# Patient Record
Sex: Female | Born: 1990 | Race: White | Hispanic: No | Marital: Single | Smoking: Never smoker
Health system: Southern US, Community
[De-identification: ages and names within clinical notes are randomized; demographics above are authoritative.]

## PROBLEM LIST (undated history)

## (undated) ENCOUNTER — Inpatient Hospital Stay (HOSPITAL_COMMUNITY): Payer: Self-pay

## (undated) DIAGNOSIS — F419 Anxiety disorder, unspecified: Secondary | ICD-10-CM

## (undated) DIAGNOSIS — A749 Chlamydial infection, unspecified: Secondary | ICD-10-CM

## (undated) HISTORY — PX: CARPAL TUNNEL RELEASE: SHX101

## (undated) HISTORY — PX: TONSILLECTOMY: SUR1361

## (undated) HISTORY — PX: ANKLE SURGERY: SHX546

## (undated) HISTORY — PX: WISDOM TOOTH EXTRACTION: SHX21

---
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
## 2014-01-06 NOTE — L&D Delivery Note (Signed)
Delivery Note 1959: Nurse call reports patient with rectal pressure and is C/C/+2.  In room for delivery.  Patient reports fatigue and desire for labor to be over.  Encouragement and reassurances given.  Patient instructed on pushing methods and delivered as below with staff and family support. FHT remained reassuring throughout pushing phase.   At 8:49 PM, on December 29, 2014, a viable female "Norma Collier" was delivered via Vaginal, Spontaneous Delivery (Presentation: Occiput Anterior).  After delivery of head, nuchal cord noted that shoulders and body was delivered through via somersault maneuver. Infant with good tone and spontaneous cry. Tactile stimulation given by provider and infant placed on mother's abdomen where nurse continued tactile stimulation. Infant APGAR: 8, 9.  Cord clamped, cut, and blood collected. Placenta delivered spontaneously and noted to be intact with 3VC upon inspection. Patient expresses desire to take placenta home. Vaginal inspection revealed small right periurethral abrasion that was repaired for hemostasis. Repair made with a 3.0 vicryl on a SH-needle with no need for additional anesthetic; patient tolerated procedure well.  Fundus firm, at 3FB below the umbilicus, and bleeding small.  Mother hemodynamically stable and infant skin to skin prior to provider exit.  Mother unsure of birth control method and opts to breastfeed.   Family wishes for infant to be circumcised during inpatient stay. Infant weight at one hour of life: 6lbs 10.9oz, 19.5in  Anesthesia: Epidural  Episiotomy:  None Lacerations:  None--Right Periurethral Abrasion Suture Repair: 3.0 vicryl Est. Blood Loss (mL):  450  Mom to postpartum.  Baby to Couplet care / Skin to Skin.  Cherre RobinsJessica L Leslie Jester MSN, CNM 12/29/2014, 9:20 PM

## 2014-06-08 ENCOUNTER — Inpatient Hospital Stay (HOSPITAL_COMMUNITY)
Admission: AD | Admit: 2014-06-08 | Discharge: 2014-06-08 | Disposition: A | Discharge status: Home / Self Care | Source: Ambulatory Visit | Attending: Obstetrics & Gynecology | Admitting: Obstetrics & Gynecology

## 2014-06-08 ENCOUNTER — Encounter (HOSPITAL_COMMUNITY): Payer: Self-pay | Admitting: *Deleted

## 2014-06-08 ENCOUNTER — Inpatient Hospital Stay (HOSPITAL_COMMUNITY)

## 2014-06-08 DIAGNOSIS — R109 Unspecified abdominal pain: Secondary | ICD-10-CM | POA: Diagnosis not present

## 2014-06-08 DIAGNOSIS — O219 Vomiting of pregnancy, unspecified: Secondary | ICD-10-CM | POA: Diagnosis not present

## 2014-06-08 DIAGNOSIS — O9989 Other specified diseases and conditions complicating pregnancy, childbirth and the puerperium: Secondary | ICD-10-CM | POA: Diagnosis not present

## 2014-06-08 DIAGNOSIS — Z3A08 8 weeks gestation of pregnancy: Secondary | ICD-10-CM | POA: Insufficient documentation

## 2014-06-08 DIAGNOSIS — O210 Mild hyperemesis gravidarum: Secondary | ICD-10-CM | POA: Insufficient documentation

## 2014-06-08 DIAGNOSIS — O26899 Other specified pregnancy related conditions, unspecified trimester: Secondary | ICD-10-CM

## 2014-06-08 LAB — URINALYSIS, ROUTINE W REFLEX MICROSCOPIC
BILIRUBIN URINE: NEGATIVE
GLUCOSE, UA: NEGATIVE mg/dL
Hgb urine dipstick: NEGATIVE
KETONES UR: 40 mg/dL — AB
Nitrite: NEGATIVE
Protein, ur: NEGATIVE mg/dL
Urobilinogen, UA: 0.2 mg/dL (ref 0.0–1.0)
pH: 5.5 (ref 5.0–8.0)

## 2014-06-08 LAB — CBC WITH DIFFERENTIAL/PLATELET
Basophils Absolute: 0 10*3/uL (ref 0.0–0.1)
Basophils Relative: 0 % (ref 0–1)
EOS PCT: 0 % (ref 0–5)
Eosinophils Absolute: 0 10*3/uL (ref 0.0–0.7)
HEMATOCRIT: 39.9 % (ref 36.0–46.0)
HEMOGLOBIN: 14.2 g/dL (ref 12.0–15.0)
Lymphocytes Relative: 5 % — ABNORMAL LOW (ref 12–46)
Lymphs Abs: 0.5 10*3/uL — ABNORMAL LOW (ref 0.7–4.0)
MCH: 30.1 pg (ref 26.0–34.0)
MCHC: 35.6 g/dL (ref 30.0–36.0)
MCV: 84.7 fL (ref 78.0–100.0)
MONOS PCT: 4 % (ref 3–12)
Monocytes Absolute: 0.4 10*3/uL (ref 0.1–1.0)
NEUTROS PCT: 91 % — AB (ref 43–77)
Neutro Abs: 9.5 10*3/uL — ABNORMAL HIGH (ref 1.7–7.7)
PLATELETS: 177 10*3/uL (ref 150–400)
RBC: 4.71 MIL/uL (ref 3.87–5.11)
RDW: 12.9 % (ref 11.5–15.5)
WBC: 10.4 10*3/uL (ref 4.0–10.5)

## 2014-06-08 LAB — HCG, QUANTITATIVE, PREGNANCY: HCG, BETA CHAIN, QUANT, S: 185872 m[IU]/mL — AB (ref <5)

## 2014-06-08 LAB — URINE MICROSCOPIC-ADD ON

## 2014-06-08 LAB — WET PREP, GENITAL
Trich, Wet Prep: NONE SEEN
YEAST WET PREP: NONE SEEN

## 2014-06-08 LAB — POCT PREGNANCY, URINE: Preg Test, Ur: POSITIVE — AB

## 2014-06-08 IMAGING — US US OB TRANSVAGINAL
1 series · 14 of 28 positions shown · non-contrast
Comparison: None.

CLINICAL DATA: Pregnant patient with lower abdominal pain for 1
day.

EXAM:
OBSTETRIC <14 WK US AND TRANSVAGINAL OB US
TECHNIQUE: Both transabdominal and transvaginal ultrasound examinations were
performed for complete evaluation of the gestation as well as the
maternal uterus, adnexal regions, and pelvic cul-de-sac.
Transvaginal technique was performed to assess early pregnancy.

[Series 1: us ob comp less 14 wk · 14 of 43 slices shown]
[im 2/43]
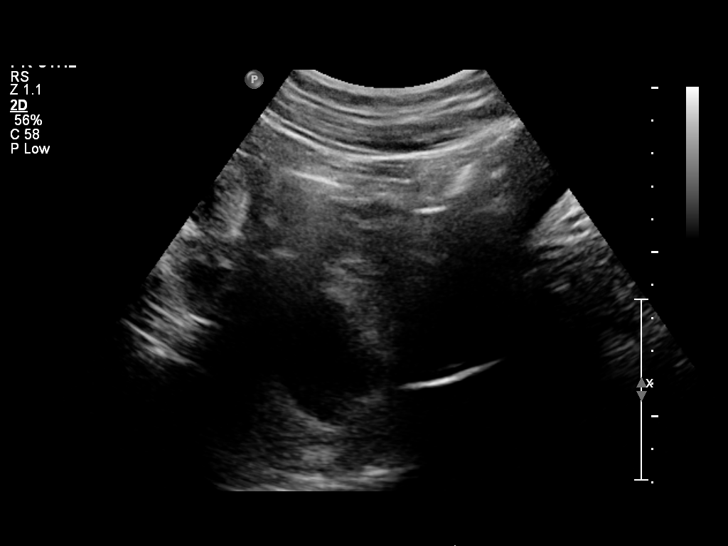
[im 5/43]
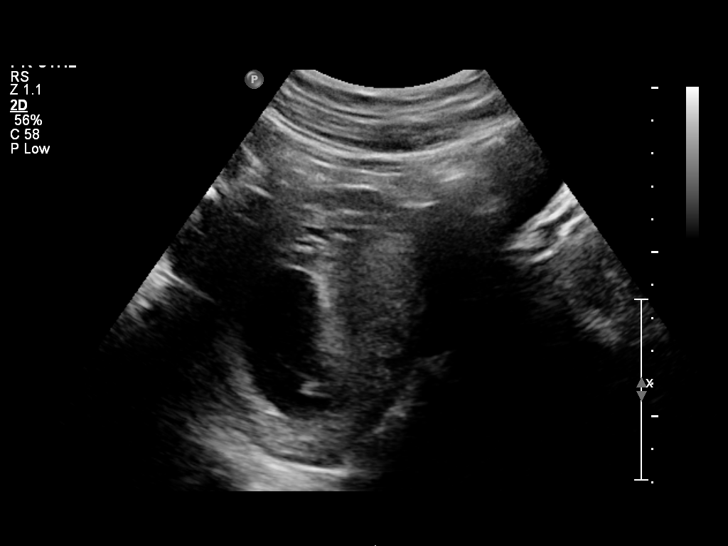
[im 8/43]
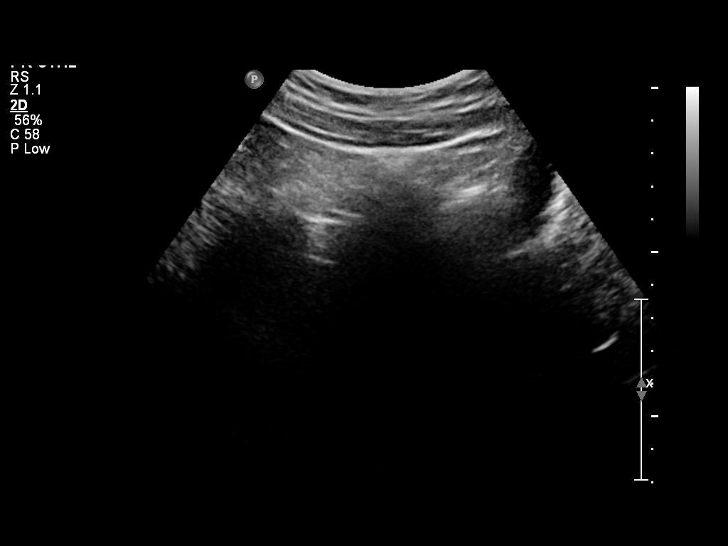
[im 11/43]
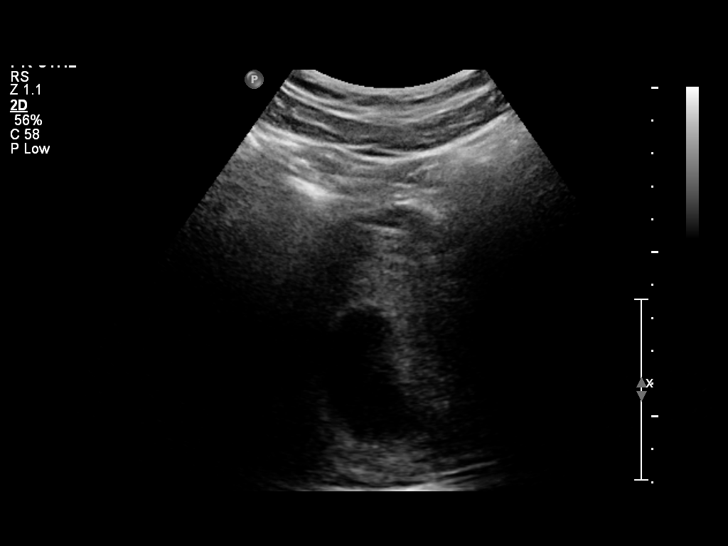
[im 15/43]
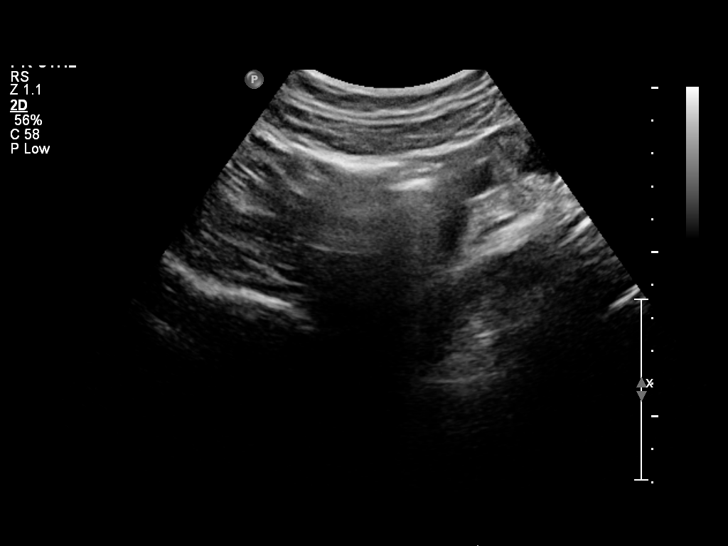
[im 18/43]
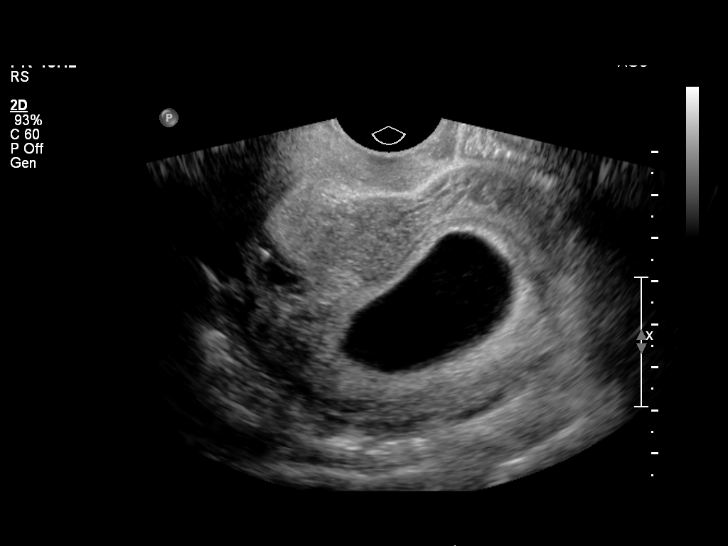
[im 21/43]
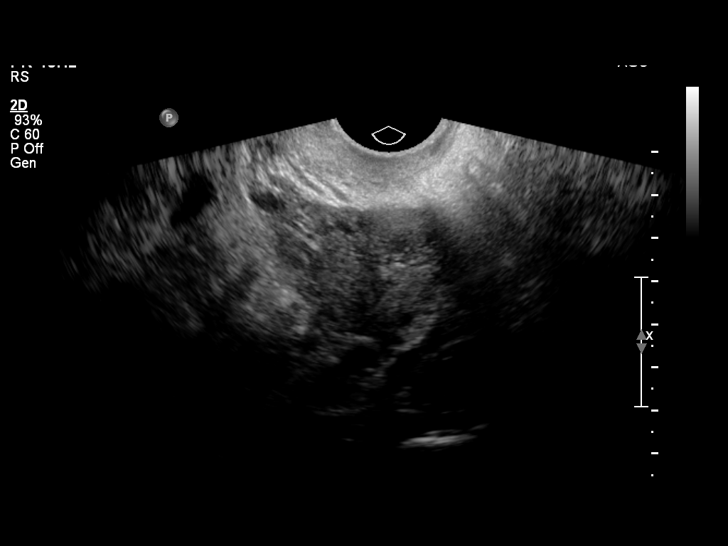
[im 24/43]
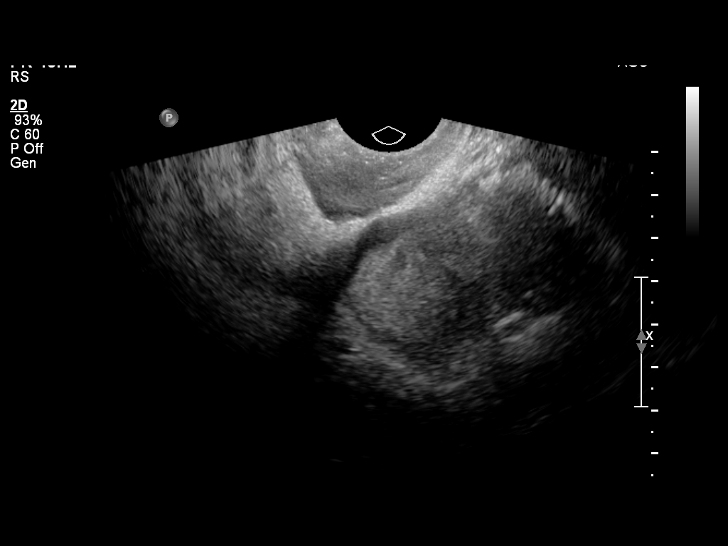
[im 27/43]
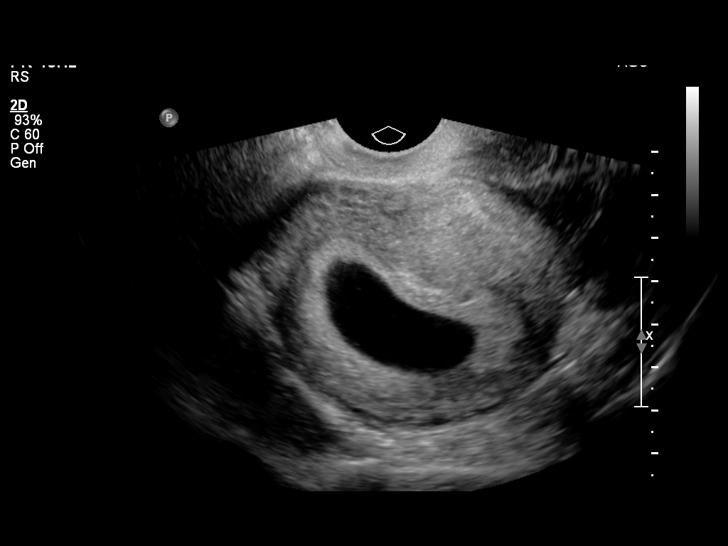
[im 30/43]
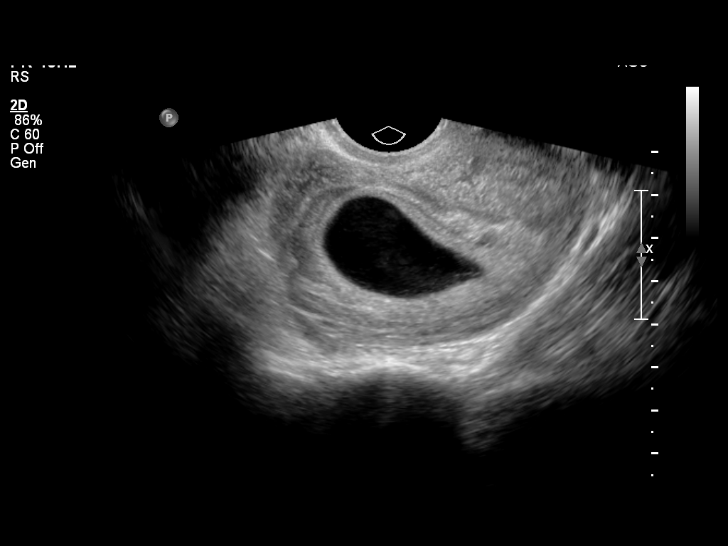
[im 33/43]
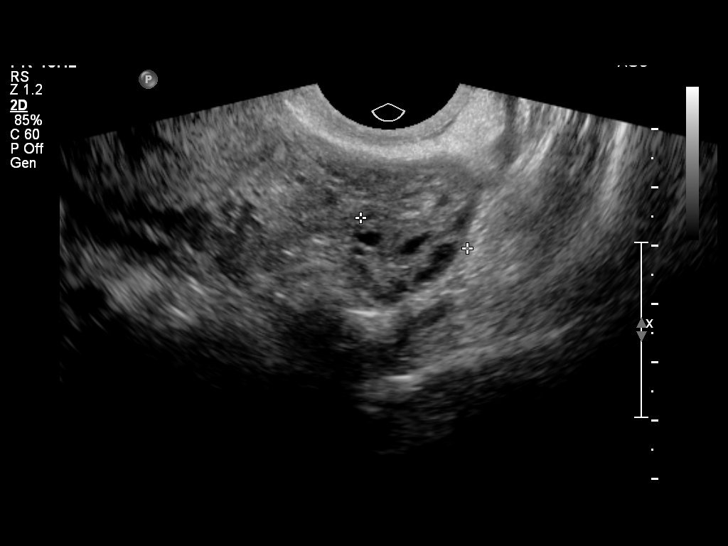
[im 36/43]
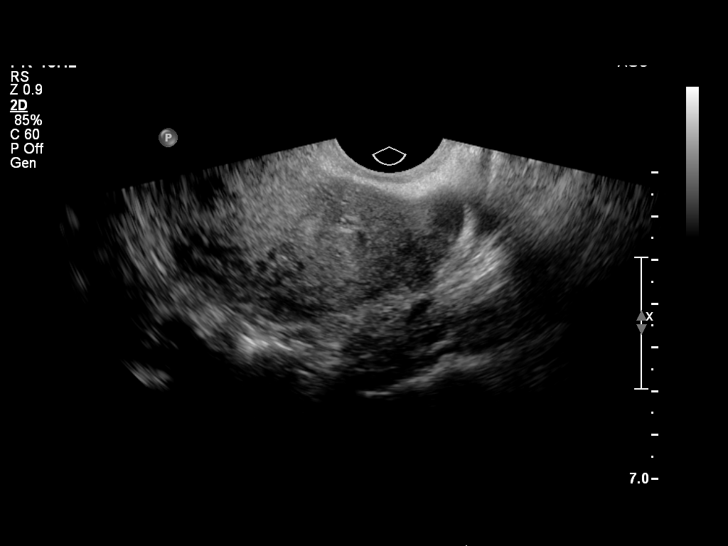
[im 39/43]
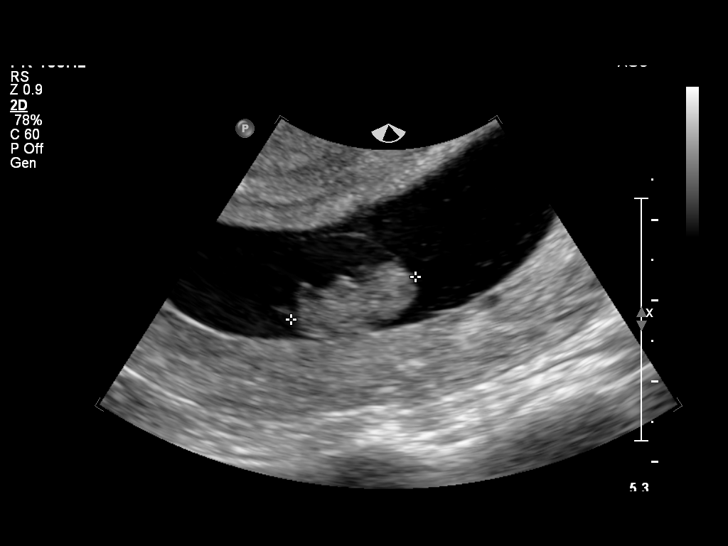
[im 43/43]
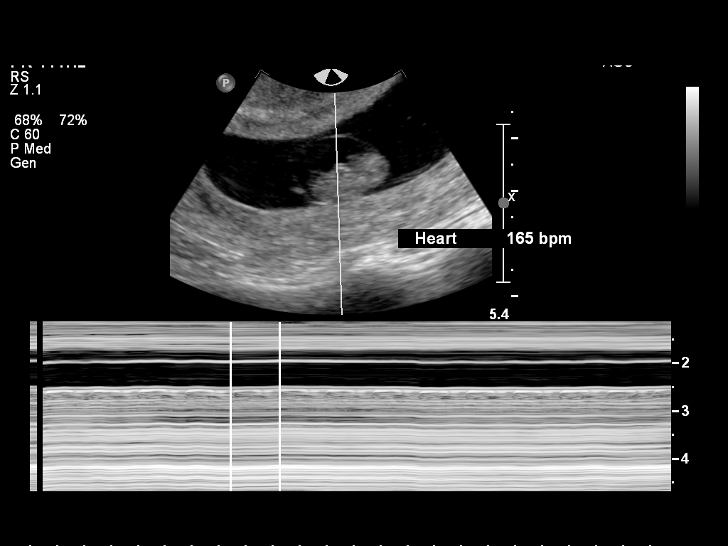

[14 of 28 positions shown; findings below may reference images not displayed]

FINDINGS: Intrauterine gestational sac: Visualized/normal in shape.

Yolk sac:  Present.

Embryo:  Present.

Cardiac Activity: Present.

Heart Rate: 165  bpm

CRL:  15.4  mm   8 w   0 d                  US EDC: [DATE]

Maternal uterus/adnexae: No subchorionic hemorrhage. The left ovary
appears normal measuring 1.9 x 2.6 x 1.2 cm. The right ovary is not
definitively seen. There is no pelvic free fluid. No adnexal mass.
IMPRESSION: Single live intrauterine pregnancy estimated gestational age 8 weeks
0 day for estimated date of delivery [DATE]. No complication.

## 2014-06-08 MED ORDER — PROMETHAZINE HCL 25 MG/ML IJ SOLN
25.0000 mg | Freq: Once | INTRAVENOUS | Status: AC
  Administered 2014-06-08: 25 mg via INTRAVENOUS
  Filled 2014-06-08: qty 1

## 2014-06-08 MED ORDER — PROMETHAZINE HCL 25 MG PO TABS: 25.0000 mg | ORAL_TABLET | Freq: Four times a day (QID) | ORAL | Status: DC | PRN

## 2014-06-08 NOTE — MAU Note (Signed)
Nausea/vomiting since 1am this morning. Cannot hold anything down since last night. Some lower abdominal cramping. Denies bleeding.

## 2014-06-08 NOTE — Discharge Instructions (Signed)
Morning Sickness °Morning sickness is when you feel sick to your stomach (nauseous) during pregnancy. You may feel sick to your stomach and throw up (vomit). You may feel sick in the morning, but you can feel this way any time of day. Some women feel very sick to their stomach and cannot stop throwing up (hyperemesis gravidarum). °HOME CARE °· Only take medicines as told by your doctor. °· Take multivitamins as told by your doctor. Taking multivitamins before getting pregnant can stop or lessen the harshness of morning sickness. °· Eat dry toast or unsalted crackers before getting out of bed. °· Eat 5 to 6 small meals a day. °· Eat dry and bland foods like rice and baked potatoes. °· Do not drink liquids with meals. Drink between meals. °· Do not eat greasy, fatty, or spicy foods. °· Have someone cook for you if the smell of food causes you to feel sick or throw up. °· If you feel sick to your stomach after taking prenatal vitamins, take them at night or with a snack. °· Eat protein when you need a snack (nuts, yogurt, cheese). °· Eat unsweetened gelatins for dessert. °· Wear a bracelet used for sea sickness (acupressure wristband). °· Go to a doctor that puts thin needles into certain body points (acupuncture) to improve how you feel. °· Do not smoke. °· Use a humidifier to keep the air in your house free of odors. °· Get lots of fresh air. °GET HELP IF: °· You need medicine to feel better. °· You feel dizzy or lightheaded. °· You are losing weight. °GET HELP RIGHT AWAY IF:  °· You feel very sick to your stomach and cannot stop throwing up. °· You pass out (faint). °MAKE SURE YOU: °· Understand these instructions. °· Will watch your condition. °· Will get help right away if you are not doing well or get worse. °Document Released: 01/31/2004 Document Revised: 12/28/2012 Document Reviewed: 06/09/2012 °ExitCare® Patient Information ©2015 [employer]. This information is not intended to replace advice given to you by  your health care provider. Make sure you discuss any questions you have with your health care provider. ° °Eating Plan for Hyperemesis Gravidarum °Severe cases of hyperemesis gravidarum can lead to dehydration and malnutrition. The hyperemesis eating plan is one way to lessen the symptoms of nausea and vomiting. It is often used with prescribed medicines to control your symptoms.  °WHAT CAN I DO TO RELIEVE MY SYMPTOMS? °Listen to your body. Everyone is different and has different preferences. Find what works best for you. Some of the following things may help: °· Eat and drink slowly. °· Eat 5-6 small meals daily instead of 3 large meals.   °· Eat crackers before you get out of bed in the morning.   °· Starchy foods are usually well tolerated (such as cereal, toast, bread, potatoes, pasta, rice, and pretzels).   °· Ginger may help with nausea. Add ¼ tsp ground ginger to hot tea or choose ginger tea.   °· Try drinking 100% fruit juice or an electrolyte drink. °· Continue to take your prenatal vitamins as directed by your health care provider. If you are having trouble taking your prenatal vitamins, talk with your health care provider about different options. °· Include at least 1 serving of protein with your meals and snacks (such as meats or poultry, beans, nuts, eggs, or yogurt). Try eating a protein-rich snack before bed (such as cheese and crackers or a half turkey or peanut butter sandwich). °WHAT THINGS SHOULD I   AVOID TO REDUCE MY SYMPTOMS? °The following things may help reduce your symptoms: °· Avoid foods with strong smells. Try eating meals in well-ventilated areas that are free of odors. °· Avoid drinking water or other beverages with meals. Try not to drink anything less than 30 minutes before and after meals. °· Avoid drinking more than 1 cup of fluid at a time. °· Avoid fried or high-fat foods, such as butter and cream sauces. °· Avoid spicy foods. °· Avoid skipping meals the best you can. Nausea can be  more intense on an empty stomach. If you cannot tolerate food at that time, do not force it. Try sucking on ice chips or other frozen items and make up the calories later. °· Avoid lying down within 2 hours after eating. °Document Released: 10/20/2006 Document Revised: 12/28/2012 Document Reviewed: 10/27/2012 °ExitCare® Patient Information ©2015 [employer]. This information is not intended to replace advice given to you by your health care provider. Make sure you discuss any questions you have with your health care provider. ° °

## 2014-06-08 NOTE — MAU Provider Note (Signed)
History     CSN: [id number]  Arrival date and time: 06/08/14 [id number]   First Provider Initiated Contact with Patient 06/08/14 2036      Chief Complaint  Patient presents with  . Emesis During Pregnancy   HPI  Ms. Norma Collier is a 24 y.o. G1P0 at 4635w0d who presents to MAU today with complaint of N/V. The patient states that [place] from [place] and began having severe N/V today. She is unable to keep anything down. She also states increased heartburn and lower abdominal cramping. She denies diarrhea, sick contacts, UTI symptoms or fever. She states pain is 4/10 now. She has not taken anything for pain.   OB History    Gravida Para Term Preterm AB TAB SAB Ectopic Multiple Living   1               History reviewed. No pertinent past medical history.  Past Surgical History  Procedure Laterality Date  . Tonsillectomy    . Ankle surgery    . Wisdom tooth extraction      No family history on file.  History  Substance Use Topics  . Smoking status: Never Smoker   . Smokeless tobacco: Never Used  . Alcohol Use: Yes     Comment: rare, when not pregnant     Allergies: No Known Allergies  No prescriptions prior to admission    Review of Systems  Constitutional: Negative for fever and malaise/fatigue.  Gastrointestinal: Positive for nausea, vomiting and abdominal pain. Negative for diarrhea and constipation.  Genitourinary: Negative for dysuria, urgency and frequency.       Neg - vaginal bleeding, discharge   Physical Exam   Blood pressure 98/52, pulse 80, temperature 97.6 F (36.4 C), resp. rate 20, height 5\' 6"  (1.676 m), weight 130 lb (58.968 kg), last menstrual period 04/01/2014.  Physical Exam  Nursing note and vitals reviewed. Constitutional: She is oriented to person, place, and time. She appears well-developed and well-nourished. No distress.  HENT:  Head: Normocephalic and atraumatic.  Cardiovascular: Normal rate.   Respiratory: Effort normal.   GI: Soft. Bowel sounds are normal. She exhibits no distension and no mass. There is tenderness (mild tenderness to palpation of the lower abdomen bilaterally). There is no rebound and no guarding.  Neurological: She is alert and oriented to person, place, and time.  Skin: Skin is warm and dry. No erythema.  Psychiatric: She has a normal mood and affect.   Results for orders placed or performed during the hospital encounter of 06/08/14 (from the past 24 hour(s))  Urinalysis, Routine w reflex microscopic ([facility])     Status: Abnormal   Collection Time: 06/08/14  8:03 PM  Result Value Ref Range   Color, Urine YELLOW YELLOW   APPearance CLEAR CLEAR   Specific Gravity, Urine >1.030 (H) 1.005 - 1.030   pH 5.5 5.0 - 8.0   Glucose, UA NEGATIVE NEGATIVE mg/dL   Hgb urine dipstick NEGATIVE NEGATIVE   Bilirubin Urine NEGATIVE NEGATIVE   Ketones, ur 40 (A) NEGATIVE mg/dL   Protein, ur NEGATIVE NEGATIVE mg/dL   Urobilinogen, UA 0.2 0.0 - 1.0 mg/dL   Nitrite NEGATIVE NEGATIVE   Leukocytes, UA TRACE (A) NEGATIVE  Urine microscopic-add on     Status: None   Collection Time: 06/08/14  8:03 PM  Result Value Ref Range   Squamous Epithelial / LPF RARE RARE   WBC, UA 3-6 <3 WBC/hpf   RBC / HPF 0-2 <3  RBC/hpf   Bacteria, UA RARE RARE   Urine-Other MUCOUS PRESENT   Pregnancy, urine POC     Status: Abnormal   Collection Time: 06/08/14  8:08 PM  Result Value Ref Range   Preg Test, Ur POSITIVE (A) NEGATIVE  CBC with Differential/Platelet     Status: Abnormal   Collection Time: 06/08/14  9:00 PM  Result Value Ref Range   WBC 10.4 4.0 - 10.5 K/uL   RBC 4.71 3.87 - 5.11 MIL/uL   Hemoglobin 14.2 12.0 - 15.0 g/dL   HCT 16.1 09.6 - 04.5 %   MCV 84.7 78.0 - 100.0 fL   MCH 30.1 26.0 - 34.0 pg   MCHC 35.6 30.0 - 36.0 g/dL   RDW 40.9 81.1 - 91.4 %   Platelets 177 150 - 400 K/uL   Neutrophils Relative % 91 (H) 43 - 77 %   Neutro Abs 9.5 (H) 1.7 - 7.7 K/uL   Lymphocytes Relative 5 (L) 12 - 46 %    Lymphs Abs 0.5 (L) 0.7 - 4.0 K/uL   Monocytes Relative 4 3 - 12 %   Monocytes Absolute 0.4 0.1 - 1.0 K/uL   Eosinophils Relative 0 0 - 5 %   Eosinophils Absolute 0.0 0.0 - 0.7 K/uL   Basophils Relative 0 0 - 1 %   Basophils Absolute 0.0 0.0 - 0.1 K/uL  ABO/Rh     Status: None (Preliminary result)   Collection Time: 06/08/14  9:00 PM  Result Value Ref Range   ABO/RH(D) O POS   hCG, quantitative, pregnancy     Status: Abnormal   Collection Time: 06/08/14  9:00 PM  Result Value Ref Range   hCG, Beta Chain, Quant, S [id number] (H) <5 mIU/mL  Wet prep, genital     Status: Abnormal   Collection Time: 06/08/14  9:00 PM  Result Value Ref Range   Yeast Wet Prep HPF POC NONE SEEN NONE SEEN   Trich, Wet Prep NONE SEEN NONE SEEN   Clue Cells Wet Prep HPF POC RARE (A) NONE SEEN   WBC, Wet Prep HPF POC MANY (A) NONE SEEN   US Ob Comp Less 14 Wks  06/08/2014   CLINICAL DATA:  Pregnant patient with lower abdominal pain for 1 day.  EXAM: OBSTETRIC <14 WK [place] AND TRANSVAGINAL OB US  TECHNIQUE: Both transabdominal and transvaginal ultrasound examinations were performed for complete evaluation of the gestation as well as the maternal uterus, adnexal regions, and pelvic cul-de-sac. Transvaginal technique was performed to assess early pregnancy.  COMPARISON:  None.  FINDINGS: Intrauterine gestational sac: Visualized/normal in shape.  Yolk sac:  Present.  Embryo:  Present.  Cardiac Activity: Present.  Heart Rate: 165  bpm  CRL:  15.4  mm   8 w   0 d                  [place] EDC: 01/18/2015  Maternal uterus/adnexae: No subchorionic hemorrhage. The left ovary appears normal measuring 1.9 x 2.6 x 1.2 cm. The right ovary is not definitively seen. There is no pelvic free fluid. No adnexal mass.  IMPRESSION: Single live intrauterine pregnancy estimated gestational age [redacted] weeks 0 day for estimated date of delivery 01/18/2015. No complication.   Electronically Signed   By: Rubye Oaks M.D.   On: 06/08/2014 22:11   US Ob  Transvaginal  06/08/2014   CLINICAL DATA:  Pregnant patient with lower abdominal pain for 1 day.  EXAM: OBSTETRIC <14 WK [place] AND TRANSVAGINAL OB  US  TECHNIQUE: Both transabdominal and transvaginal ultrasound examinations were performed for complete evaluation of the gestation as well as the maternal uterus, adnexal regions, and pelvic cul-de-sac. Transvaginal technique was performed to assess early pregnancy.  COMPARISON:  None.  FINDINGS: Intrauterine gestational sac: Visualized/normal in shape.  Yolk sac:  Present.  Embryo:  Present.  Cardiac Activity: Present.  Heart Rate: 165  bpm  CRL:  15.4  mm   8 w   0 d                  [place] EDC: 01/18/2015  Maternal uterus/adnexae: No subchorionic hemorrhage. The left ovary appears normal measuring 1.9 x 2.6 x 1.2 cm. The right ovary is not definitively seen. There is no pelvic free fluid. No adnexal mass.  IMPRESSION: Single live intrauterine pregnancy estimated gestational age [redacted] weeks 0 day for estimated date of delivery 01/18/2015. No complication.   Electronically Signed   By: Rubye Oaks M.D.   On: 06/08/2014 22:11    MAU Course  Procedures None  MDM +UPT UA, wet prep, GC/chlamydia, CBC, ABO/Rh, quant hCG, HIV, RPR and [place] today to rule out ectopic pregnancy IV LR with 25 mg Phenergan infusion given Patient reports improvement in symptoms. No episodes of emesis while in MAU Assessment and Plan  A: SIUP at [redacted]w[redacted]d Nausea and vomiting in pregnancy prior to [redacted] weeks gestation  P: Discharge home Rx for Phenergan given to patient First trimester precautions discussed Patient advised to follow-up with OB provider at home as scheduled for routine prenatal care Patient may return to MAU as needed or if her condition were to change or worsen   Marny Lowenstein, PA-C  06/08/2014, 11:57 PM

## 2014-06-09 LAB — HIV ANTIBODY (ROUTINE TESTING W REFLEX): HIV Screen 4th Generation wRfx: NONREACTIVE

## 2014-06-09 LAB — GC/CHLAMYDIA PROBE AMP (~~LOC~~) NOT AT ARMC
CHLAMYDIA, DNA PROBE: NEGATIVE
NEISSERIA GONORRHEA: NEGATIVE

## 2014-06-09 LAB — RPR: RPR Ser Ql: NONREACTIVE

## 2014-06-09 LAB — ABO/RH: ABO/RH(D): O POS

## 2014-07-04 LAB — OB RESULTS CONSOLE GC/CHLAMYDIA: Gonorrhea: NEGATIVE

## 2014-07-04 LAB — OB RESULTS CONSOLE HEPATITIS B SURFACE ANTIGEN: HEP B S AG: NEGATIVE

## 2014-07-04 LAB — OB RESULTS CONSOLE RUBELLA ANTIBODY, IGM: RUBELLA: IMMUNE

## 2014-07-04 LAB — OB RESULTS CONSOLE ANTIBODY SCREEN: ANTIBODY SCREEN: NEGATIVE

## 2014-12-29 ENCOUNTER — Encounter (HOSPITAL_COMMUNITY): Payer: Self-pay

## 2014-12-29 ENCOUNTER — Inpatient Hospital Stay (HOSPITAL_COMMUNITY)
Admission: AD | Admit: 2014-12-29 | Discharge: 2014-12-31 | DRG: 775 | Disposition: A | Discharge status: Home / Self Care | Payer: Medicaid Other | Source: Ambulatory Visit | Attending: Obstetrics and Gynecology | Admitting: Obstetrics and Gynecology

## 2014-12-29 ENCOUNTER — Inpatient Hospital Stay (HOSPITAL_COMMUNITY)
Admission: AD | Admit: 2014-12-29 | Discharge: 2014-12-29 | Disposition: A | Discharge status: Home / Self Care | Payer: Medicaid Other | Source: Ambulatory Visit | Attending: Obstetrics and Gynecology | Admitting: Obstetrics and Gynecology

## 2014-12-29 ENCOUNTER — Inpatient Hospital Stay (HOSPITAL_COMMUNITY): Payer: Medicaid Other | Admitting: Anesthesiology

## 2014-12-29 DIAGNOSIS — O99344 Other mental disorders complicating childbirth: Secondary | ICD-10-CM | POA: Diagnosis present

## 2014-12-29 DIAGNOSIS — F419 Anxiety disorder, unspecified: Secondary | ICD-10-CM | POA: Diagnosis present

## 2014-12-29 DIAGNOSIS — Z3A37 37 weeks gestation of pregnancy: Secondary | ICD-10-CM | POA: Diagnosis not present

## 2014-12-29 DIAGNOSIS — E559 Vitamin D deficiency, unspecified: Secondary | ICD-10-CM | POA: Diagnosis present

## 2014-12-29 HISTORY — DX: Anxiety disorder, unspecified: F41.9

## 2014-12-29 LAB — TYPE AND SCREEN
ABO/RH(D): O POS
Antibody Screen: NEGATIVE

## 2014-12-29 LAB — CBC
HEMATOCRIT: 34.5 % — AB (ref 36.0–46.0)
HEMOGLOBIN: 11.4 g/dL — AB (ref 12.0–15.0)
MCH: 28.1 pg (ref 26.0–34.0)
MCHC: 33 g/dL (ref 30.0–36.0)
MCV: 85.2 fL (ref 78.0–100.0)
Platelets: 175 10*3/uL (ref 150–400)
RBC: 4.05 MIL/uL (ref 3.87–5.11)
RDW: 13.8 % (ref 11.5–15.5)
WBC: 11.5 10*3/uL — AB (ref 4.0–10.5)

## 2014-12-29 MED ORDER — DIPHENHYDRAMINE HCL 50 MG/ML IJ SOLN: 12.5000 mg | INTRAMUSCULAR | Status: DC | PRN

## 2014-12-29 MED ORDER — EPHEDRINE 5 MG/ML INJ
10.0000 mg | INTRAVENOUS | Status: DC | PRN
  Filled 2014-12-29: qty 2

## 2014-12-29 MED ORDER — SODIUM CHLORIDE 0.9 % IV SOLN: 250.0000 mL | INTRAVENOUS | Status: DC | PRN

## 2014-12-29 MED ORDER — LIDOCAINE HCL (PF) 1 % IJ SOLN
30.0000 mL | INTRAMUSCULAR | Status: DC | PRN
  Filled 2014-12-29: qty 30

## 2014-12-29 MED ORDER — OXYTOCIN BOLUS FROM INFUSION
500.0000 mL | INTRAVENOUS | Status: DC
  Administered 2014-12-29: 500 mL via INTRAVENOUS

## 2014-12-29 MED ORDER — SODIUM CHLORIDE 0.9 % IJ SOLN: 3.0000 mL | Freq: Two times a day (BID) | INTRAMUSCULAR | Status: DC

## 2014-12-29 MED ORDER — LIDOCAINE HCL (PF) 1 % IJ SOLN
INTRAMUSCULAR | Status: DC | PRN
  Administered 2014-12-29 (×2): 4 mL

## 2014-12-29 MED ORDER — LACTATED RINGERS IV SOLN: 500.0000 mL | INTRAVENOUS | Status: DC | PRN

## 2014-12-29 MED ORDER — ACETAMINOPHEN 325 MG PO TABS: 650.0000 mg | ORAL_TABLET | ORAL | Status: DC | PRN

## 2014-12-29 MED ORDER — NALBUPHINE HCL 10 MG/ML IJ SOLN
5.0000 mg | INTRAMUSCULAR | Status: DC | PRN
  Administered 2014-12-29: 5 mg via INTRAVENOUS
  Filled 2014-12-29: qty 1

## 2014-12-29 MED ORDER — LACTATED RINGERS IV SOLN
INTRAVENOUS | Status: DC
  Administered 2014-12-29: 18:00:00 via INTRAVENOUS

## 2014-12-29 MED ORDER — PHENYLEPHRINE 40 MCG/ML (10ML) SYRINGE FOR IV PUSH (FOR BLOOD PRESSURE SUPPORT)
80.0000 ug | PREFILLED_SYRINGE | INTRAVENOUS | Status: DC | PRN
Start: 2014-12-29 — End: 2014-12-30
  Filled 2014-12-29: qty 20
  Filled 2014-12-29: qty 2

## 2014-12-29 MED ORDER — FENTANYL 2.5 MCG/ML BUPIVACAINE 1/10 % EPIDURAL INFUSION (WH - ANES)
14.0000 mL/h | INTRAMUSCULAR | Status: DC | PRN
  Administered 2014-12-29 (×2): 14 mL/h via EPIDURAL
  Filled 2014-12-29 (×2): qty 125

## 2014-12-29 MED ORDER — OXYTOCIN 40 UNITS IN LACTATED RINGERS INFUSION - SIMPLE MED
62.5000 mL/h | INTRAVENOUS | Status: DC
  Filled 2014-12-29: qty 1000

## 2014-12-29 MED ORDER — OXYCODONE-ACETAMINOPHEN 5-325 MG PO TABS
1.0000 | ORAL_TABLET | ORAL | Status: DC | PRN
  Filled 2014-12-29 (×3): qty 1

## 2014-12-29 MED ORDER — CITRIC ACID-SODIUM CITRATE 334-500 MG/5ML PO SOLN: 30.0000 mL | ORAL | Status: DC | PRN

## 2014-12-29 MED ORDER — OXYCODONE-ACETAMINOPHEN 5-325 MG PO TABS
2.0000 | ORAL_TABLET | ORAL | Status: DC | PRN
  Filled 2014-12-29 (×4): qty 2

## 2014-12-29 MED ORDER — ONDANSETRON HCL 4 MG/2ML IJ SOLN
4.0000 mg | Freq: Four times a day (QID) | INTRAMUSCULAR | Status: DC | PRN
Start: 2014-12-29 — End: 2014-12-31
  Administered 2014-12-29: 4 mg via INTRAVENOUS
  Filled 2014-12-29: qty 2

## 2014-12-29 MED ORDER — SODIUM CHLORIDE 0.9 % IJ SOLN: 3.0000 mL | INTRAMUSCULAR | Status: DC | PRN

## 2014-12-29 NOTE — Progress Notes (Signed)
Notified of cervical exam. Will discharge pt home with labor precautions

## 2014-12-29 NOTE — Anesthesia Preprocedure Evaluation (Signed)
Anesthesia Evaluation  Patient identified by MRN, date of birth, ID band Patient awake    Reviewed: Allergy & Precautions, NPO status , Patient's Chart, lab work & pertinent test results  History of Anesthesia Complications Negative for: history of anesthetic complications  Airway Mallampati: II  TM Distance: >3 FB Neck ROM: Full    Dental no notable dental hx. (+) Dental Advisory Given   Pulmonary neg pulmonary ROS,    Pulmonary exam normal breath sounds clear to auscultation       Cardiovascular negative cardio ROS Normal cardiovascular exam Rhythm:Regular Rate:Normal     Neuro/Psych PSYCHIATRIC DISORDERS Anxiety negative neurological ROS     GI/Hepatic negative GI ROS, Neg liver ROS,   Endo/Other  negative endocrine ROS  Renal/GU negative Renal ROS  negative genitourinary   Musculoskeletal negative musculoskeletal ROS (+)   Abdominal   Peds negative pediatric ROS (+)  Hematology negative hematology ROS (+)   Anesthesia Other Findings   Reproductive/Obstetrics (+) Pregnancy                             Anesthesia Physical Anesthesia Plan  ASA: II  Anesthesia Plan: Epidural   Post-op Pain Management:    Induction:   Airway Management Planned:   Additional Equipment:   Intra-op Plan:   Post-operative Plan:   Informed Consent: I have reviewed the patients History and Physical, chart, labs and discussed the procedure including the risks, benefits and alternatives for the proposed anesthesia with the patient or authorized representative who has indicated his/her understanding and acceptance.   Dental advisory given  Plan Discussed with: CRNA  Anesthesia Plan Comments:         Anesthesia Quick Evaluation

## 2014-12-29 NOTE — Progress Notes (Signed)
Labor Progress  Subjective: Comfortable with epidural.  Very flat affect  Objective: BP 115/71 mmHg  Pulse 72  Temp(Src) 98.6 F (37 C) (Axillary)  Resp 20  Ht 5\' 6"  (1.676 m)  Wt 158 lb (71.668 kg)  BMI 25.51 kg/m2  SpO2 99%  LMP 04/01/2014     FHT: 135, moderate variability, + accel, nodecels CTX:  regular, every 4-5 minutes Uterus gravid, soft non tender SVE:  Dilation: 8 Effacement (%): 100 Station: 0 Exam by:: Norma Collier, cnm   Assessment:  IUP at 37.1 weeks NICHD: Category 1 Membranes:  AROM x 2hrs, no s/s of infection Labor progress: Idquate labor progress GBS: negative   Plan: Continue labor plan Continuous monitoring Rest Frequent position changes to facilitate fetal rotation and descent.       Mehran Guderian, CNM, MSN 12/29/2014. 6:37 PM

## 2014-12-29 NOTE — Discharge Instructions (Signed)
Third Trimester of Pregnancy °The third trimester is from week 29 through week 42, months 7 through 9. The third trimester is a time when the fetus is growing rapidly. At the end of the ninth month, the fetus is about 20 inches in length and weighs 6-10 pounds.  °BODY CHANGES °Your body goes through many changes during pregnancy. The changes vary from woman to woman.  °· Your weight will continue to increase. You can expect to gain 25-35 pounds (11-16 kg) by the end of the pregnancy. °· You may begin to get stretch marks on your hips, abdomen, and breasts. °· You may urinate more often because the fetus is moving lower into your pelvis and pressing on your bladder. °· You may develop or continue to have heartburn as a result of your pregnancy. °· You may develop constipation because certain hormones are causing the muscles that push waste through your intestines to slow down. °· You may develop hemorrhoids or swollen, bulging veins (varicose veins). °· You may have pelvic pain because of the weight gain and pregnancy hormones relaxing your joints between the bones in your pelvis. Backaches may result from overexertion of the muscles supporting your posture. °· You may have changes in your hair. These can include thickening of your hair, rapid growth, and changes in texture. Some women also have hair loss during or after pregnancy, or hair that feels dry or thin. Your hair will most likely return to normal after your baby is born. °· Your breasts will continue to grow and be tender. A yellow discharge may leak from your breasts called colostrum. °· Your belly button may stick out. °· You may feel short of breath because of your expanding uterus. °· You may notice the fetus "dropping," or moving lower in your abdomen. °· You may have a bloody mucus discharge. This usually occurs a few days to a week before labor begins. °· Your cervix becomes thin and soft (effaced) near your due date. °WHAT TO EXPECT AT YOUR PRENATAL  EXAMS  °You will have prenatal exams every 2 weeks until week 36. Then, you will have weekly prenatal exams. During a routine prenatal visit: °· You will be weighed to make sure you and the fetus are growing normally. °· Your blood pressure is taken. °· Your abdomen will be measured to track your baby's growth. °· The fetal heartbeat will be listened to. °· Any test results from the previous visit will be discussed. °· You may have a cervical check near your due date to see if you have effaced. °At around 36 weeks, your caregiver will check your cervix. At the same time, your caregiver will also perform a test on the secretions of the vaginal tissue. This test is to determine if a type of bacteria, Group B streptococcus, is present. Your caregiver will explain this further. °Your caregiver may ask you: °· What your birth plan is. °· How you are feeling. °· If you are feeling the baby move. °· If you have had any abnormal symptoms, such as leaking fluid, bleeding, severe headaches, or abdominal cramping. °· If you are using any tobacco products, including cigarettes, chewing tobacco, and electronic cigarettes. °· If you have any questions. °Other tests or screenings that may be performed during your third trimester include: °· Blood tests that check for low iron levels (anemia). °· Fetal testing to check the health, activity level, and growth of the fetus. Testing is done if you have certain medical conditions or if   there are problems during the pregnancy. °· HIV (human immunodeficiency virus) testing. If you are at high risk, you may be screened for HIV during your third trimester of pregnancy. °FALSE LABOR °You may feel small, irregular contractions that eventually go away. These are called Braxton Hicks contractions, or false labor. Contractions may last for hours, days, or even weeks before true labor sets in. If contractions come at regular intervals, intensify, or become painful, it is best to be seen by your  caregiver.  °SIGNS OF LABOR  °· Menstrual-like cramps. °· Contractions that are 5 minutes apart or less. °· Contractions that start on the top of the uterus and spread down to the lower abdomen and back. °· A sense of increased pelvic pressure or back pain. °· A watery or bloody mucus discharge that comes from the vagina. °If you have any of these signs before the 37th week of pregnancy, call your caregiver right away. You need to go to the hospital to get checked immediately. °HOME CARE INSTRUCTIONS  °· Avoid all smoking, herbs, alcohol, and unprescribed drugs. These chemicals affect the formation and growth of the baby. °· Do not use any tobacco products, including cigarettes, chewing tobacco, and electronic cigarettes. If you need help quitting, ask your health care provider. You may receive counseling support and other resources to help you quit. °· Follow your caregiver's instructions regarding medicine use. There are medicines that are either safe or unsafe to take during pregnancy. °· Exercise only as directed by your caregiver. Experiencing uterine cramps is a good sign to stop exercising. °· Continue to eat regular, healthy meals. °· Wear a good support bra for breast tenderness. °· Do not use hot tubs, steam rooms, or saunas. °· Wear your seat belt at all times when driving. °· Avoid raw meat, uncooked cheese, cat litter boxes, and soil used by cats. These carry germs that can cause birth defects in the baby. °· Take your prenatal vitamins. °· Take 1500-2000 mg of calcium daily starting at the 20th week of pregnancy until you deliver your baby. °· Try taking a stool softener (if your caregiver approves) if you develop constipation. Eat more high-fiber foods, such as fresh vegetables or fruit and whole grains. Drink plenty of fluids to keep your urine clear or pale yellow. °· Take warm sitz baths to soothe any pain or discomfort caused by hemorrhoids. Use hemorrhoid cream if your caregiver approves. °· If  you develop varicose veins, wear support hose. Elevate your feet for 15 minutes, 3-4 times a day. Limit salt in your diet. °· Avoid heavy lifting, wear low heal shoes, and practice good posture. °· Rest a lot with your legs elevated if you have leg cramps or low back pain. °· Visit your dentist if you have not gone during your pregnancy. Use a soft toothbrush to brush your teeth and be gentle when you floss. °· A sexual relationship may be continued unless your caregiver directs you otherwise. °· Do not travel far distances unless it is absolutely necessary and only with the approval of your caregiver. °· Take prenatal classes to understand, practice, and ask questions about the labor and delivery. °· Make a trial run to the hospital. °· Pack your hospital bag. °· Prepare the baby's nursery. °· Continue to go to all your prenatal visits as directed by your caregiver. °SEEK MEDICAL CARE IF: °· You are unsure if you are in labor or if your water has broken. °· You have dizziness. °· You have   mild pelvic cramps, pelvic pressure, or nagging pain in your abdominal area. °· You have persistent nausea, vomiting, or diarrhea. °· You have a bad smelling vaginal discharge. °· You have pain with urination. °SEEK IMMEDIATE MEDICAL CARE IF:  °· You have a fever. °· You are leaking fluid from your vagina. °· You have spotting or bleeding from your vagina. °· You have severe abdominal cramping or pain. °· You have rapid weight loss or gain. °· You have shortness of breath with chest pain. °· You notice sudden or extreme swelling of your face, hands, ankles, feet, or legs. °· You have not felt your baby move in over an hour. °· You have severe headaches that do not go away with medicine. °· You have vision changes. °  °This information is not intended to replace advice given to you by your health care provider. Make sure you discuss any questions you have with your health care provider. °  °Document Released: 12/17/2000 Document  Revised: 01/13/2014 Document Reviewed: 02/24/2012 °Elsevier Interactive Patient Education ©2016 [employer] °Fetal Movement Counts °Patient Name: __________________________________________________ Patient Due Date: ____________________ °Performing a fetal movement count is highly recommended in high-risk pregnancies, but it is good for every pregnant woman to do. Your health care provider may ask you to start counting fetal movements at 28 weeks of the pregnancy. Fetal movements often increase: °· After eating a full meal. °· After physical activity. °· After eating or drinking something sweet or cold. °· At rest. °Pay attention to when you feel the baby is most active. This will help you notice a pattern of your baby's sleep and wake cycles and what factors contribute to an increase in fetal movement. It is important to perform a fetal movement count at the same time each day when your baby is normally most active.  °HOW TO COUNT FETAL MOVEMENTS °· Find a quiet and comfortable area to sit or lie down on your left side. Lying on your left side provides the best blood and oxygen circulation to your baby. °· Write down the day and time on a sheet of paper or in a journal. °· Start counting kicks, flutters, swishes, rolls, or jabs in a 2-hour period. You should feel at least 10 movements within 2 hours. °· If you do not feel 10 movements in 2 hours, wait 2-3 hours and count again. Look for a change in the pattern or not enough counts in 2 hours. °SEEK MEDICAL CARE IF: °· You feel less than 10 counts in 2 hours, tried twice. °· There is no movement in over an hour. °· The pattern is changing or taking longer each day to reach 10 counts in 2 hours. °· You feel the baby is not moving as he or she usually does. °Date: ____________ Movements: ____________ Start time: ____________ Finish time: ____________  °Date: ____________ Movements: ____________ Start time: ____________ Finish time: ____________ °Date: ____________  Movements: ____________ Start time: ____________ Finish time: ____________ °Date: ____________ Movements: ____________ Start time: ____________ Finish time: ____________ °Date: ____________ Movements: ____________ Start time: ____________ Finish time: ____________ °Date: ____________ Movements: ____________ Start time: ____________ Finish time: ____________ °Date: ____________ Movements: ____________ Start time: ____________ Finish time: ____________ °Date: ____________ Movements: ____________ Start time: ____________ Finish time: ____________  °Date: ____________ Movements: ____________ Start time: ____________ Finish time: ____________ °Date: ____________ Movements: ____________ Start time: ____________ Finish time: ____________ °Date: ____________ Movements: ____________ Start time: ____________ Finish time: ____________ °Date: ____________ Movements: ____________ Start time: ____________ Finish time: ____________ °Date:   ____________ Movements: ____________ Start time: ____________ Finish time: ____________ °Date: ____________ Movements: ____________ Start time: ____________ Finish time: ____________ °Date: ____________ Movements: ____________ Start time: ____________ Finish time: ____________  °Date: ____________ Movements: ____________ Start time: ____________ Finish time: ____________ °Date: ____________ Movements: ____________ Start time: ____________ Finish time: ____________ °Date: ____________ Movements: ____________ Start time: ____________ Finish time: ____________ °Date: ____________ Movements: ____________ Start time: ____________ Finish time: ____________ °Date: ____________ Movements: ____________ Start time: ____________ Finish time: ____________ °Date: ____________ Movements: ____________ Start time: ____________ Finish time: ____________ °Date: ____________ Movements: ____________ Start time: ____________ Finish time: ____________  °Date: ____________ Movements: ____________ Start time:  ____________ Finish time: ____________ °Date: ____________ Movements: ____________ Start time: ____________ Finish time: ____________ °Date: ____________ Movements: ____________ Start time: ____________ Finish time: ____________ °Date: ____________ Movements: ____________ Start time: ____________ Finish time: ____________ °Date: ____________ Movements: ____________ Start time: ____________ Finish time: ____________ °Date: ____________ Movements: ____________ Start time: ____________ Finish time: ____________ °Date: ____________ Movements: ____________ Start time: ____________ Finish time: ____________  °Date: ____________ Movements: ____________ Start time: ____________ Finish time: ____________ °Date: ____________ Movements: ____________ Start time: ____________ Finish time: ____________ °Date: ____________ Movements: ____________ Start time: ____________ Finish time: ____________ °Date: ____________ Movements: ____________ Start time: ____________ Finish time: ____________ °Date: ____________ Movements: ____________ Start time: ____________ Finish time: ____________ °Date: ____________ Movements: ____________ Start time: ____________ Finish time: ____________ °Date: ____________ Movements: ____________ Start time: ____________ Finish time: ____________  °Date: ____________ Movements: ____________ Start time: ____________ Finish time: ____________ °Date: ____________ Movements: ____________ Start time: ____________ Finish time: ____________ °Date: ____________ Movements: ____________ Start time: ____________ Finish time: ____________ °Date: ____________ Movements: ____________ Start time: ____________ Finish time: ____________ °Date: ____________ Movements: ____________ Start time: ____________ Finish time: ____________ °Date: ____________ Movements: ____________ Start time: ____________ Finish time: ____________ °Date: ____________ Movements: ____________ Start time: ____________ Finish time: ____________  °Date:  ____________ Movements: ____________ Start time: ____________ Finish time: ____________ °Date: ____________ Movements: ____________ Start time: ____________ Finish time: ____________ °Date: ____________ Movements: ____________ Start time: ____________ Finish time: ____________ °Date: ____________ Movements: ____________ Start time: ____________ Finish time: ____________ °Date: ____________ Movements: ____________ Start time: ____________ Finish time: ____________ °Date: ____________ Movements: ____________ Start time: ____________ Finish time: ____________ °Date: ____________ Movements: ____________ Start time: ____________ Finish time: ____________  °Date: ____________ Movements: ____________ Start time: ____________ Finish time: ____________ °Date: ____________ Movements: ____________ Start time: ____________ Finish time: ____________ °Date: ____________ Movements: ____________ Start time: ____________ Finish time: ____________ °Date: ____________ Movements: ____________ Start time: ____________ Finish time: ____________ °Date: ____________ Movements: ____________ Start time: ____________ Finish time: ____________ °Date: ____________ Movements: ____________ Start time: ____________ Finish time: ____________ °  °This information is not intended to replace advice given to you by your health care provider. Make sure you discuss any questions you have with your health care provider. °  °Document Released: 01/22/2006 Document Revised: 01/13/2014 Document Reviewed: 10/20/2011 °Elsevier Interactive Patient Education ©2016 [employer] °Braxton Hicks Contractions °Contractions of the uterus can occur throughout pregnancy. Contractions are not always a sign that you are in labor.  °WHAT ARE BRAXTON HICKS CONTRACTIONS?  °Contractions that occur before labor are called Braxton Hicks contractions, or false labor. Toward the end of pregnancy (32-34 weeks), these contractions can develop more often and may become more  forceful. This is not true labor because these contractions do not result in opening (dilatation) and thinning of the cervix. They are sometimes difficult to tell apart from true labor because these contractions can be forceful and people have different pain tolerances. You should   not feel embarrassed if you go to the hospital with false labor. Sometimes, the only way to tell if you are in true labor is for your health care provider to look for changes in the cervix. °If there are no prenatal problems or other health problems associated with the pregnancy, it is completely safe to be sent home with false labor and await the onset of true labor. °HOW CAN YOU TELL THE DIFFERENCE BETWEEN TRUE AND FALSE LABOR? °False Labor °· The contractions of false labor are usually shorter and not as hard as those of true labor.   °· The contractions are usually irregular.   °· The contractions are often felt in the front of the lower abdomen and in the groin.   °· The contractions may go away when you walk around or change positions while lying down.   °· The contractions get weaker and are shorter lasting as time goes on.   °· The contractions do not usually become progressively stronger, regular, and closer together as with true labor.   °True Labor °· Contractions in true labor last 30-70 seconds, become very regular, usually become more intense, and increase in frequency.   °· The contractions do not go away with walking.   °· The discomfort is usually felt in the top of the uterus and spreads to the lower abdomen and low back.   °· True labor can be determined by your health care provider with an exam. This will show that the cervix is dilating and getting thinner.   °WHAT TO REMEMBER °· Keep up with your usual exercises and follow other instructions given by your health care provider.   °· Take medicines as directed by your health care provider.   °· Keep your regular prenatal appointments.   °· Eat and drink lightly if you  think you are going into labor.   °· If Braxton Hicks contractions are making you uncomfortable:   °· Change your position from lying down or resting to walking, or from walking to resting.   °· Sit and rest in a tub of warm water.   °· Drink 2-3 glasses of water. Dehydration may cause these contractions.   °· Do slow and deep breathing several times an hour.   °WHEN SHOULD I SEEK IMMEDIATE MEDICAL CARE? °Seek immediate medical care if: °· Your contractions become stronger, more regular, and closer together.   °· You have fluid leaking or gushing from your vagina.   °· You have a fever.   °· You pass blood-tinged mucus.   °· You have vaginal bleeding.   °· You have continuous abdominal pain.   °· You have low back pain that you never had before.   °· You feel your baby's head pushing down and causing pelvic pressure.   °· Your baby is not moving as much as it used to.   °  °This information is not intended to replace advice given to you by your health care provider. Make sure you discuss any questions you have with your health care provider. °  °Document Released: 12/23/2004 Document Revised: 12/28/2012 Document Reviewed: 10/04/2012 °Elsevier Interactive Patient Education ©2016 [employer] ° °

## 2014-12-29 NOTE — MAU Note (Signed)
Patient presents with ctx 2-5 mins apart since yesterday at 2300. Denies LOF. Some vaginal bleeding started today at 1045. Also complains of decreased fetal movement since Wednesday.

## 2014-12-29 NOTE — MAU Note (Signed)
Pt c/o contractions all day, but around 10pm they became stronger-5-7 mins apart. Having a lot vaginal pressure. Has a thin, watery discharge that she noticed around 11pm. Denies vag bleeding. +FM

## 2014-12-29 NOTE — H&P (Signed)
Norma Collier is a 24 y.o. female, G1 P0 at 37.1 weeks presents from the office with ctx adn bloody show  Patient Active Problem List   Diagnosis Date Noted  . Normal labor 12/29/2014    Pregnancy Course: Patient transferred are at 37.6 weeks.   EDC of 01/16/14 was established by LMP.      [place] evaluations:   11.2 weeks So [facility] - large.    16.6 weeks - FHR 167,  Posterior placenta, normal fluid,     20.1 weeks - Anatomy: EFW 11oz - 49%, FHR 158, cervical length 5.7, echogenic intracardic focus, left ventricular EIF, vertex, no previa, female     34 weeks - FU: vertex, FHR 141,EFW 2267gm-40%, AFI 16.0   Significant prenatal events:   Hx anxiety, Vit D deficiency,    Last evaluation:   4/100/-2 in the office today  Reason for admission:  Labor  Pt States:   Contractions Frequency: 3-4         Contraction severity: strong         Fetal activity: +FM  OB History    Gravida Para Term Preterm AB TAB SAB Ectopic Multiple Living   1              Past Medical History  Diagnosis Date  . Anxiety    Past Surgical History  Procedure Laterality Date  . Tonsillectomy    . Ankle surgery    . Wisdom tooth extraction     Family History: family history is not on file. Social History:  reports that she has never smoked. She has never used smokeless tobacco. She reports that she drinks alcohol. She reports that she does not use illicit drugs.   Prenatal Transfer Tool  Maternal Diabetes: No Genetic Screening: Normal Maternal Ultrasounds/Referrals: Abnormal:  Findings:   Isolated EIF (echogenic intracardiac focus) Fetal Ultrasounds or other Referrals:  None Maternal Substance Abuse:  No Significant Maternal Medications:  None Significant Maternal Lab Results: None   ROS:  See HPI above, all other systems are negative  No Known Allergies  Dilation: 6 Effacement (%): 90, 100 Exam by:: Kenta Laster, cnm Blood pressure 116/68, pulse 77, temperature 98.8 F (37.1 C), temperature  source Oral, resp. rate 18, height  (1.676 m), weight 158 lb (71.668 kg), last menstrual period 04/01/2014.  Maternal Exam:  Uterine Assessment: Contraction frequency is rare.  Abdomen: Gravid, non tender. Fundal height is aga.  Normal external genitalia, vulva, cervix, uterus and adnexa.  No lesions noted on exam.  Pelvis adequate for delivery.  Fetal presentation: Vertex by VE  Fetal Exam:  Monitor Surveillance : Continuous Monitoring / Intermitting per -  Mode: Ultrasound.  NICHD: Category 1 CTXs: Q 4-13minutes EFW   7 lbs  Physical Exam: Nursing note and vitals reviewed General: alert and cooperative She appears well nourished Psychiatric: Normal mood and affect. Her behavior is normal Head: Normocephalic Eyes: Pupils are equal, round, and reactive to light Neck: Normal range of motion Cardiovascular: RRR without murmur  Respiratory: CTAB. Effort normal  Abd: soft, non-tender, +BS, no rebound, no guarding  Genitourinary: Vagina normal  Neurological: A&Ox3 Skin: Warm and dry  Musculoskeletal: Normal range of motion  Homan's sign negative bilaterally No evidence of DVTs.  Edema: Minimal bilaterally non-pitting edema DTR: 2+ Clonus: None   Prenatal labs: ABO, Rh: --/--/O POS (12/23 1450) Antibody: PENDING (12/23 1450) Rubella:  immune RPR: Non Reactive (06/02 2100)  HBsAg: Negative (06/28 0000)  HIV: Non Reactive (06/02  2100)  GBS:  negative Sickle cell/Hgb electrophoresis:  WNL Pap:  wnl 07/18/14 GC:  negative  Chlamydia: negative Genetic screenings:  negative Glucola:  wnl  Assessment:  IUP at 37.1 weeks NICHD: Category 1 Membranes: BBW GBS negative  Plan:  Admit to L&D for expectant management of labor. Possible augmentation options reviewed including pitocin.  IV pain medication per orders PRN Epidural per patient request Foley cath after patient is comfortable with epidural Anticipate SVD Labor mgmt as ordered    Attending MD available at  all times.     Rito Lecomte, CNM, MSN 12/29/2014, 4:03 PM

## 2014-12-29 NOTE — Anesthesia Procedure Notes (Signed)
Epidural Patient location during procedure: OB  Staffing Anesthesiologist: Toniette Devera Performed by: anesthesiologist   Preanesthetic Checklist Completed: patient identified, site marked, surgical consent, pre-op evaluation, timeout performed, IV checked, risks and benefits discussed and monitors and equipment checked  Epidural Patient position: sitting Prep: site prepped and draped and DuraPrep Patient monitoring: continuous pulse ox and blood pressure Approach: midline Location: L3-L4 Injection technique: LOR saline  Needle:  Needle type: Tuohy  Needle gauge: 17 G Needle length: 9 cm and 9 Needle insertion depth: 5 cm cm Catheter type: closed end flexible Catheter size: 19 Gauge Catheter at skin depth: 10 cm Test dose: negative  Assessment Events: blood not aspirated, injection not painful, no injection resistance, negative IV test and no paresthesia  Additional Notes Patient identified. Risks/Benefits/Options discussed with patient including but not limited to bleeding, infection, nerve damage, paralysis, failed block, incomplete pain control, headache, blood pressure changes, nausea, vomiting, reactions to medication both or allergic, itching and postpartum back pain. Confirmed with bedside nurse the patient's most recent platelet count. Confirmed with patient that they are not currently taking any anticoagulation, have any bleeding history or any family history of bleeding disorders. Patient expressed understanding and wished to proceed. All questions were answered. Sterile technique was used throughout the entire procedure. Please see nursing notes for vital signs. Test dose was given through epidural catheter and negative prior to continuing to dose epidural or start infusion. Warning signs of high block given to the patient including shortness of breath, tingling/numbness in hands, complete motor block, or any concerning symptoms with instructions to call for help. Patient was  given instructions on fall risk and not to get out of bed. All questions and concerns addressed with instructions to call with any issues or inadequate analgesia.      

## 2014-12-29 NOTE — Progress Notes (Signed)
Notified of pt arrival in MAU, fern negative and cervical exam. Will recheck in 1 hour

## 2014-12-29 NOTE — H&P (Signed)
Labor Progress  Subjective: C/O being tired and ctx pain.  Pt asked for IV pain meds  Objective: BP 116/68 mmHg  Pulse 77  Temp(Src) 98.8 F (37.1 C) (Oral)  Resp 18  Ht 5\' 6"  (1.676 m)  Wt 158 lb (71.668 kg)  BMI 25.51 kg/m2  LMP 04/01/2014     FHT: 140, moderate variability, + accel CTX:  regular, every 4-5 minutes Uterus gravid, soft non tender SVE:  Dilation: 6 Effacement (%): 90, 100 Exam by:: Rene Sizelove, cnm   Assessment:  IUP at 37.1 weeks NICHD: Category 2 Membranes:  AROM at 1540 Labor progress: active labor GBS: negative   Plan: Continue labor plan Continuous monitoring Rest/Ambulate Frequent position changes to facilitate fetal rotation and descent. Will reassess with cervical exam at 1800 or earlier if necessary Epidural per pt request      Javione Gunawan, CNM, MSN 12/29/2014. 4:12 PM

## 2014-12-30 ENCOUNTER — Encounter (HOSPITAL_COMMUNITY): Payer: Self-pay

## 2014-12-30 LAB — RPR: RPR: NONREACTIVE

## 2014-12-30 LAB — CBC
HEMATOCRIT: 27.9 % — AB (ref 36.0–46.0)
HEMOGLOBIN: 9.1 g/dL — AB (ref 12.0–15.0)
MCH: 28.2 pg (ref 26.0–34.0)
MCHC: 32.6 g/dL (ref 30.0–36.0)
MCV: 86.4 fL (ref 78.0–100.0)
Platelets: 133 10*3/uL — ABNORMAL LOW (ref 150–400)
RBC: 3.23 MIL/uL — ABNORMAL LOW (ref 3.87–5.11)
RDW: 14 % (ref 11.5–15.5)
WBC: 11.1 10*3/uL — AB (ref 4.0–10.5)

## 2014-12-30 MED ORDER — WITCH HAZEL-GLYCERIN EX PADS
1.0000 | MEDICATED_PAD | CUTANEOUS | Status: DC | PRN
Start: 2014-12-30 — End: 2014-12-31

## 2014-12-30 MED ORDER — DIBUCAINE 1 % RE OINT: 1.0000 "application " | TOPICAL_OINTMENT | RECTAL | Status: DC | PRN

## 2014-12-30 MED ORDER — BENZOCAINE-MENTHOL 20-0.5 % EX AERO
1.0000 | INHALATION_SPRAY | CUTANEOUS | Status: DC | PRN
Start: 2014-12-30 — End: 2014-12-31
  Administered 2014-12-30 – 2014-12-31 (×2): 1 via TOPICAL
  Filled 2014-12-30 (×2): qty 56

## 2014-12-30 MED ORDER — OXYCODONE-ACETAMINOPHEN 5-325 MG PO TABS
1.0000 | ORAL_TABLET | ORAL | Status: DC | PRN
Start: 2014-12-30 — End: 2014-12-31
  Administered 2014-12-30 (×2): 1 via ORAL

## 2014-12-30 MED ORDER — OXYCODONE-ACETAMINOPHEN 5-325 MG PO TABS
2.0000 | ORAL_TABLET | ORAL | Status: DC | PRN
  Administered 2014-12-30 – 2014-12-31 (×4): 2 via ORAL

## 2014-12-30 MED ORDER — ZOLPIDEM TARTRATE 5 MG PO TABS: 5.0000 mg | ORAL_TABLET | Freq: Every evening | ORAL | Status: DC | PRN

## 2014-12-30 MED ORDER — SIMETHICONE 80 MG PO CHEW: 80.0000 mg | CHEWABLE_TABLET | ORAL | Status: DC | PRN

## 2014-12-30 MED ORDER — LANOLIN HYDROUS EX OINT: TOPICAL_OINTMENT | CUTANEOUS | Status: DC | PRN

## 2014-12-30 MED ORDER — TETANUS-DIPHTH-ACELL PERTUSSIS 5-2.5-18.5 LF-MCG/0.5 IM SUSP: 0.5000 mL | Freq: Once | INTRAMUSCULAR | Status: DC

## 2014-12-30 MED ORDER — SENNOSIDES-DOCUSATE SODIUM 8.6-50 MG PO TABS
2.0000 | ORAL_TABLET | ORAL | Status: DC
  Administered 2014-12-30 – 2014-12-31 (×2): 2 via ORAL
  Filled 2014-12-30 (×2): qty 2

## 2014-12-30 MED ORDER — ONDANSETRON HCL 4 MG/2ML IJ SOLN: 4.0000 mg | INTRAMUSCULAR | Status: DC | PRN

## 2014-12-30 MED ORDER — ONDANSETRON HCL 4 MG PO TABS
4.0000 mg | ORAL_TABLET | ORAL | Status: DC | PRN
  Administered 2014-12-31: 4 mg via ORAL
  Filled 2014-12-30: qty 1

## 2014-12-30 MED ORDER — DIPHENHYDRAMINE HCL 25 MG PO CAPS: 25.0000 mg | ORAL_CAPSULE | Freq: Four times a day (QID) | ORAL | Status: DC | PRN

## 2014-12-30 MED ORDER — PRENATAL MULTIVITAMIN CH
1.0000 | ORAL_TABLET | Freq: Every day | ORAL | Status: DC
  Administered 2014-12-30 – 2014-12-31 (×2): 1 via ORAL
  Filled 2014-12-30 (×2): qty 1

## 2014-12-30 MED ORDER — IBUPROFEN 600 MG PO TABS
600.0000 mg | ORAL_TABLET | Freq: Four times a day (QID) | ORAL | Status: DC
Start: 2014-12-30 — End: 2014-12-31
  Administered 2014-12-30 – 2014-12-31 (×7): 600 mg via ORAL
  Filled 2014-12-30 (×7): qty 1

## 2014-12-30 MED ORDER — ACETAMINOPHEN 325 MG PO TABS
650.0000 mg | ORAL_TABLET | ORAL | Status: DC | PRN
  Administered 2014-12-30: 650 mg via ORAL
  Filled 2014-12-30: qty 2

## 2014-12-30 NOTE — Progress Notes (Signed)
Subjective: Postpartum Day 1: Vaginal delivery, small right periurethral laceration Patient up ad lib, reports no syncope or dizziness. Feeding:  Breast/bottle Contraceptive plan:  Undecided  Planning inpatient circumcision.  Objective: Vital signs in last 24 hours: Temp:  [97.9 F (36.6 C)-98.8 F (37.1 C)] 98.2 F (36.8 C) (12/24 0400) Pulse Rate:  [65-116] 72 (12/24 0400) Resp:  [18-20] 18 (12/24 0400) BP: (79-128)/(37-80) 113/70 mmHg (12/24 0400) SpO2:  [98 %-100 %] 99 % (12/23 1631) Weight:  [71.668 kg (158 lb)] 71.668 kg (158 lb) (12/23 1450)  Physical Exam:  General: alert Lochia: appropriate Uterine Fundus: firm Perineum: healing well DVT Evaluation: No evidence of DVT seen on physical exam. Negative Homan's sign.   CBC Latest Ref Rng 12/30/2014 12/29/2014 06/08/2014  WBC 4.0 - 10.5 K/uL 11.1(H) 11.5(H) 10.4  Hemoglobin 12.0 - 15.0 g/dL 1.6(X9.1(L) 11.4(L) 14.2  Hematocrit 36.0 - 46.0 % 27.9(L) 34.5(L) 39.9  Platelets 150 - 400 K/uL 133(L) 175 177     Assessment/Plan: Status post vaginal delivery day 1. Stable Continue current care. Plan for discharge tomorrow   Nigel BridgemanLATHAM, Traye Bates, CNM 12/30/2014, 8:43 AM

## 2014-12-30 NOTE — Anesthesia Postprocedure Evaluation (Signed)
Anesthesia Post Note  Patient: Norma Collier  Procedure(s) Performed: * No procedures listed *  Patient location during evaluation: Mother Baby Anesthesia Type: Epidural Level of consciousness: awake Pain management: satisfactory to patient Vital Signs Assessment: post-procedure vital signs reviewed and stable Respiratory status: spontaneous breathing Cardiovascular status: stable Anesthetic complications: no    Last Vitals:  Filed Vitals:   12/30/14 0000 12/30/14 0400  BP: 115/70 113/70  Pulse: 88 72  Temp: 36.8 C 36.8 C  Resp: 18 18    Last Pain:  Filed Vitals:   12/30/14 0804  PainSc: 6                  Camay Pedigo

## 2014-12-30 NOTE — Clinical Social Work Maternal (Signed)
CLINICAL SOCIAL WORK MATERNAL/CHILD NOTE  Patient Details  Name: Norma Collier MRN: [id number] Date of Birth: 1990-11-26  Date:  12/30/2014  Clinical Social Worker Initiating Note:  Loleta Books MSW, LCSW Date/ Time Initiated:  12/30/14/0930     Child's Name:  Norma Collier   Legal Guardian:   Lynnae January   Need for Interpreter:  None   Date of Referral:  12/29/14     Reason for Referral:  History of anxiety   Referral Source:  [facility]   Address:  [address] [place], [place] [address]  Phone number:  [phone]   Household Members:  Mother, Siblings   Natural Supports (not living in the home):  Immediate Family, Extended Family   Professional Supports: None   Employment:   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 Mental Health Concerns: [name] presents with history of anxiety and depression. [name] stated that symptoms occurred secondary to participation in the [employer]. She shared that she has previously been in therapy and prescribed medication, but denied any medication since she learned that she was pregnant.     Cognitive State:  Able to Concentrate , Alert , Goal Oriented , Linear Thinking , Insightful    Mood/Affect:  Happy , Calm , Comfortable    CSW Assessment:  CSW received request for consult due to [name] presenting with a history of anxiety.  [name] presented as quiet and reserved, but she was in a pleasant mood, displayed a full range in affect, and was observed to be holding skin-to-skin.  [name]'s speech was slow, but displayed eye contact, and engaged in a linear and goal orientated  conversation.  [name] shared that she did not have expectations for an "ideal" childbirth plan, and shared that she noted that she was able to enjoy the experience and be grateful for the infant's birth.  [name] shared that she is breast and formula feeding, and expressed satisfaction with the current feeding plan since she intends to return to school and work in the short term.  Per [name], she moved back to [place] from [place] less than one month ago. [name] stated that she was in the air [employer], and decided to move to [place] in order to be near her family and support system.  [name] reported that she is currently living with her mother, and stated that she is well supported. She stated that her mother is "excited" about the infant, and has helped prepare the home for him.  [name] reported that despite recent transition back to [place], she has been able to become enrolled in [place], [employer], and [employer].  [name] shared that she intends to start school and work in February.  [name] did not identify or admit to any anxieties or worries secondary to multiple changes in short period of time. She shared that "it could be worse", and discussed belief that she is coping well and looking forward to these changes. Per [name], the FOB is in [place], and shared that it is for the "best".   [name] forthcoming with her mental health history. Without prompting, [name]  reported that she presents with a history of depression and anxiety.  She shared that she has a history due to her prior participation in the [employer].  [name] reported history of participation in therapy and medication management.  She was unable to recall name of previous medication, since she was tried on numerous medications. [name] denied mental health complications during the pregnancy after she self-discontinued medications.   Per [name], she feels she primarily continues to cope with anxiety. She stated that she notes that sleep deprivation and overstimulation offer trigger her  anxiety.  [name] reported that when anxiety is triggered, she can experience a panic attack, but expressed confidence in her ability to self-regulate through various coping skills.  [name] shared that she is familiar with perinatal mood disorders since her best friend in [place] experienced symptoms.  CSW inquired about how [name] feels about her mental health and increased risk for symptoms based on her history, but she shared belief that she will be "okay".  [name] stated that she feels comfortable with asking for help from her medical provider, and denied any barriers to asking for and accepting help.  [name] declined need for medication at this time, and stated that she has been doing well [place]  She shared that she prefers to only take medication if needed.  [name] also declined referrals for therapy, and shared belief that it is not necessary at this time. [name] agreed to follow up with her medical providers as soon as she notes onset of symptoms.   [name] denied current questions, concerns, or needs at this time. She acknowledged ongoing CSW availability, and agreed to contact CSW if needs arise.  CSW Plan/Description:   1. Patient/Family Education: Perinatal mood and anxiety disorders  2. No Further Intervention Required/No Barriers to Discharge    Kelby FamVenning, Dre Gamino N, LCSW 12/30/2014, 10:13 AM

## 2014-12-30 NOTE — Lactation Note (Signed)
This note was copied from the chart of Norma [place] F[place]f. Lactation Consultation Note  Initial visit made.  Breastfeeding consultation services and support information given to patient.  Late preterm handout given and reviewed.  Mom is exhausted so teaching will need to be reinforced.  Baby is 6717 hours old and he has been to the breast a few times and supplemented with formula x 6.  Mom desires to both breast and formula feed but knows to put baby to breast first.  Discussed late preterm feeding norm.  Recommended initiating a DEBP for adequate breast stimulation.  DEBP set up with instructions on use, schedule and cleaning.  Baby is currently sleeping.  Recommended mom nap after pumping session.  Encouraged to call with concerns/assist prn.  Patient Name: Norma Collier ZOXWR'UToday's Date: 12/30/2014 Reason for consult: Initial assessment;Late preterm infant   Maternal Data Formula Feeding for Exclusion: Yes Reason for exclusion: Mother's choice to formula and breast feed on admission Has patient been taught Hand Expression?: Yes Does the patient have breastfeeding experience prior to this delivery?: No  Feeding Feeding Type: Breast Fed Length of feed: 15 min  LATCH Score/Interventions Latch: Repeated attempts needed to sustain latch, nipple held in mouth throughout feeding, stimulation needed to elicit sucking reflex. Intervention(s): Adjust position;Assist with latch;Breast compression  Audible Swallowing: Spontaneous and intermittent Intervention(s): Skin to skin;Hand expression  Type of Nipple: Everted at rest and after stimulation  Comfort (Breast/Nipple): Soft / non-tender     Hold (Positioning): Assistance needed to correctly position infant at breast and maintain latch. Intervention(s): Support Pillows;Breastfeeding basics reviewed;Position options;Skin to skin  LATCH Score: 8  Lactation Tools Discussed/Used Pump Review: Setup, frequency, and cleaning;Milk  Storage Initiated by:: LC Date initiated:: 12/30/14   Consult Status Consult Status: Follow-up Date: 12/31/14 Follow-up type: In-patient    Huston FoleyMOULDEN, Thelbert Gartin S 12/30/2014, 2:04 PM

## 2014-12-31 MED ORDER — OXYCODONE-ACETAMINOPHEN 5-325 MG PO TABS: 1.0000 | ORAL_TABLET | Freq: Four times a day (QID) | ORAL | Status: DC | PRN

## 2014-12-31 MED ORDER — IBUPROFEN 600 MG PO TABS: 600.0000 mg | ORAL_TABLET | Freq: Four times a day (QID) | ORAL | Status: DC

## 2014-12-31 MED ORDER — SENNOSIDES-DOCUSATE SODIUM 8.6-50 MG PO TABS: 2.0000 | ORAL_TABLET | ORAL | Status: DC

## 2014-12-31 NOTE — Discharge Summary (Signed)
OB Discharge Summary  Patient Name: Norma Collier DOB: [birth year] MRN: [id number]  Date of admission: 12/29/2014 Delivering MD: Gerrit Heck   Date of discharge: 12/31/2014  Admitting diagnosis: 38WKS, DR REFFERED 4CM DILATED Intrauterine pregnancy: [redacted]w[redacted]d     Secondary diagnosis:Active Problems:   Normal labor   SVD (spontaneous vaginal delivery)   Periurethral abrasion, delivered, current hospitalization  Additional problems:flat affect     Discharge diagnosis: Term Pregnancy Delivered                                                                     Post partum procedures:none  Augmentation: none  Complications: None  Hospital course:  Onset of Labor With Vaginal Delivery     24 y.o. yo G1P1001 at [redacted]w[redacted]d was admitted in Active Laboron 12/29/2014. Patient had an uncomplicated labor course as follows:  Membrane Rupture Time/Date: 3:42 PM ,12/29/2014   Intrapartum Procedures: Episiotomy: None [1]                                         Lacerations:  Periurethral [8]  Patient had a delivery of a Viable infant. 12/29/2014  Information for the patient's newborn:  Markeesha, Char [[id number]]  Delivery Method: Vaginal, Spontaneous Delivery (Filed from Delivery Summary)    Pateint had an uncomplicated postpartum course.  She is ambulating, tolerating a regular diet, passing flatus, and urinating well. Patient is discharged home in stable condition on No discharge date for patient encounter.Marland Kitchen    Physical exam  Filed Vitals:   12/30/14 1144 12/30/14 1227 12/30/14 1758 12/31/14 0526  BP: 104/54 107/64 99/59 113/66  Pulse: 73 79 75 73  Temp: 98.3 F (36.8 C)  97.8 F (36.6 C) 98 F (36.7 C)  TempSrc: Oral  Oral Oral  Resp: Height:      Weight:      SpO2:   97% 100%   General: alert and cooperative Lochia: appropriate Uterine Fundus: firm Incision: Healing well with no significant drainage DVT Evaluation: No evidence of DVT seen on physical  exam. Labs: Lab Results  Component Value Date   WBC 11.1* 12/30/2014   HGB 9.1* 12/30/2014   HCT 27.9* 12/30/2014   MCV 86.4 12/30/2014   PLT 133* 12/30/2014   CMP 04/22/2007  Glucose 142(H)  BUN 10  Creatinine 0.9  Sodium 139  Potassium 4.1  Chloride 103    Discharge instruction: per After Visit Summary and "Baby and Me Booklet".  Medications:  Current facility-administered medications:  .  0.9 %  sodium chloride infusion, 250 mL, Intravenous, PRN, Keandre Linden, CNM .  acetaminophen (TYLENOL) tablet 650 mg, 650 mg, Oral, Q4H PRN, Tashae Inda, CNM .  acetaminophen (TYLENOL) tablet 650 mg, 650 mg, Oral, Q4H PRN, Gerrit Heck, CNM, 650 mg at 12/30/14 0355 .  benzocaine-Menthol (DERMOPLAST) 20-0.5 % topical spray 1 application, 1 application, Topical, PRN, Gerrit Heck, CNM, 1 application at 12/31/14 [phone] .  citric acid-sodium citrate (ORACIT) solution 30 mL, 30 mL, Oral, Q2H PRN, Camy Leder, CNM .  witch hazel-glycerin (TUCKS) pad 1 application, 1 application, Topical, PRN **AND** dibucaine (NUPERCAINAL)  1 % rectal ointment 1 application, 1 application, Rectal, PRN, Gerrit Heck, CNM .  diphenhydrAMINE (BENADRYL) capsule 25 mg, 25 mg, Oral, Q6H PRN, Gerrit Heck, CNM .  ibuprofen (ADVIL,MOTRIN) tablet 600 mg, 600 mg, Oral, 4 times per day, Gerrit Heck, CNM, 600 mg at 12/31/14 4098 .  lactated ringers infusion 500-1,000 mL, 500-1,000 mL, Intravenous, PRN, Amra Shukla, CNM .  lactated ringers infusion, , Intravenous, Continuous, Tasfia Vasseur, CNM, Last Rate: 125 mL/hr at 12/29/14 1822 .  lanolin ointment, , Topical, PRN, Gerrit Heck, CNM .  lidocaine (PF) (XYLOCAINE) 1 % injection 30 mL, 30 mL, Subcutaneous, PRN, Wesly Whisenant, CNM .  nalbuphine (NUBAIN) injection 5 mg, 5 mg, Intravenous, Q2H PRN, Leonora Gores, CNM, 5 mg at 12/29/14 1534 .  ondansetron (ZOFRAN) injection 4 mg, 4 mg, Intravenous, Q6H PRN, Corwin Kuiken, CNM, 4 mg at 12/29/14 1941 .  ondansetron  (ZOFRAN) tablet 4 mg, 4 mg, Oral, Q4H PRN, 4 mg at 12/31/14 0627 **OR** ondansetron (ZOFRAN) injection 4 mg, 4 mg, Intravenous, Q4H PRN, Gerrit Heck, CNM .  oxyCODONE-acetaminophen (PERCOCET/ROXICET) 5-325 MG per tablet 1 tablet, 1 tablet, Oral, Q4H PRN, Susy Placzek, CNM .  oxyCODONE-acetaminophen (PERCOCET/ROXICET) 5-325 MG per tablet 1 tablet, 1 tablet, Oral, Q4H PRN, Gerrit Heck, CNM, 1 tablet at 12/30/14 1226 .  oxyCODONE-acetaminophen (PERCOCET/ROXICET) 5-325 MG per tablet 2 tablet, 2 tablet, Oral, Q4H PRN, Deunta Beneke, CNM .  oxyCODONE-acetaminophen (PERCOCET/ROXICET) 5-325 MG per tablet 2 tablet, 2 tablet, Oral, Q4H PRN, Gerrit Heck, CNM, 2 tablet at 12/31/14 0846 .  oxytocin (PITOCIN) IV BOLUS FROM BAG, 500 mL, Intravenous, Continuous, Amalya Salmons, CNM, Last Rate: 999 mL/hr at 12/29/14 2055, 500 mL at 12/29/14 2055 .  oxytocin (PITOCIN) IV infusion 40 units in LR 1000 mL, 62.5 mL/hr, Intravenous, Continuous, Niel Peretti, CNM, Last Rate: 62.5 mL/hr at 12/29/14 2130, 62.5 mL/hr at 12/29/14 2130 .  prenatal multivitamin tablet 1 tablet, 1 tablet, Oral, Q1200, Gerrit Heck, CNM, 1 tablet at 12/30/14 1146 .  senna-docusate (Senokot-S) tablet 2 tablet, 2 tablet, Oral, Q24H, Gerrit Heck, CNM, 2 tablet at 12/31/14 0015 .  simethicone (MYLICON) chewable tablet 80 mg, 80 mg, Oral, PRN, Gerrit Heck, CNM .  sodium chloride 0.9 % injection 3 mL, 3 mL, Intravenous, Q12H, Shaeleigh Graw, CNM .  sodium chloride 0.9 % injection 3 mL, 3 mL, Intravenous, PRN, Jaselle Pryer, CNM .  Tdap (BOOSTRIX) injection 0.5 mL, 0.5 mL, Intramuscular, Once, Gerrit Heck, CNM, 0.5 mL at 12/30/14 1000 .  zolpidem (AMBIEN) tablet 5 mg, 5 mg, Oral, QHS PRN, Gerrit Heck, CNM After Visit Meds:    Medication List    ASK your doctor about these medications        acetaminophen 500 MG tablet  Commonly known as:  TYLENOL  Take 500 mg by mouth every 6 (six) hours as needed for mild pain or headache.      prenatal multivitamin Tabs tablet  Take 1 tablet by mouth daily at 12 noon.     promethazine 25 MG tablet  Commonly known as:  PHENERGAN  Take 1 tablet (25 mg total) by mouth every 6 (six) hours as needed for nausea or vomiting.     Vitamin D (Ergocalciferol) 50000 UNITS Caps capsule  Commonly known as:  DRISDOL  Take 50,000 Units by mouth every 7 (seven) days. sundays        Diet: routine diet  Activity: Advance as tolerated. Pelvic rest for 6 weeks.   Outpatient follow up:6 weeks Follow up Appt:No  future appointments. Follow up visit: No Follow-up on file.  Postpartum contraception: Abstinence  Newborn Data: Live born female  Birth Weight: 6 lb 10.9 oz (3031 g) APGAR: 8, 9  Baby Feeding: Breast Disposition:home with mother   12/31/2014 Afton Mikelson, CNM      Postpartum Care After Vaginal Delivery  After you deliver your newborn (postpartum period), the usual stay in the hospital is 24 72 hours. If there were problems with your labor or delivery, or if you have other medical problems, you might be in the hospital longer.  While you are in the hospital, you will receive help and instructions on how to care for yourself and your newborn during the postpartum period.  While you are in the hospital:  Be sure to tell your nurses if you have pain or discomfort, as well as where you feel the pain and what makes the pain worse.  If you had an incision made near your vagina (episiotomy) or if you had some tearing during delivery, the nurses may put ice packs on your episiotomy or tear. The ice packs may help to reduce the pain and swelling.  If you are breastfeeding, you may feel uncomfortable contractions of your uterus for a couple of weeks. This is normal. The contractions help your uterus get back to normal size.  It is normal to have some bleeding after delivery.  For the first 1 3 days after delivery, the flow is red and the amount may be similar to a  period.  It is common for the flow to start and stop.  In the first few days, you may pass some small clots. Let your nurses know if you begin to pass large clots or your flow increases.  Do not  flush blood clots down the toilet before having the nurse look at them.  During the next 3 10 days after delivery, your flow should become more watery and pink or brown-tinged in color.  Ten to fourteen days after delivery, your flow should be a small amount of yellowish-white discharge.  The amount of your flow will decrease over the first few weeks after delivery. Your flow may stop in 6 8 weeks. Most women have had their flow stop by 12 weeks after delivery.  You should change your sanitary pads frequently.  Wash your hands thoroughly with soap and water for at least 20 seconds after changing pads, using the toilet, or before holding or feeding your newborn.  You should feel like you need to empty your bladder within the first 6 8 hours after delivery.  In case you become weak, lightheaded, or faint, call your nurse before you get out of bed for the first time and before you take a shower for the first time.  Within the first few days after delivery, your breasts may begin to feel tender and full. This is called engorgement. Breast tenderness usually goes away within 48 72 hours after engorgement occurs. You may also notice milk leaking from your breasts. If you are not breastfeeding, do not stimulate your breasts. Breast stimulation can make your breasts produce more milk.  Spending as much time as possible with your newborn is very important. During this time, you and your newborn can feel close and get to know each other. Having your newborn stay in your room (rooming in) will help to strengthen the bond with your newborn. It will give you time to get to know your newborn and become comfortable caring for your newborn.  Your hormones change after delivery. Sometimes the hormone changes can  temporarily cause you to feel sad or tearful. These feelings should not last more than a few days. If these feelings last longer than that, you should talk to your caregiver.  If desired, talk to your caregiver about methods of family planning or contraception.  Talk to your caregiver about immunizations. Your caregiver may want you to have the following immunizations before leaving the hospital:  Tetanus, diphtheria, and pertussis (Tdap) or tetanus and diphtheria (Td) immunization. It is very important that you and your family (including grandparents) or others caring for your newborn are up-to-date with the Tdap or Td immunizations. The Tdap or Td immunization can help protect your newborn from getting ill.  Rubella immunization.  Varicella (chickenpox) immunization.  Influenza immunization. You should receive this annual immunization if you did not receive the immunization during your pregnancy. Document Released: 10/20/2006 Document Revised: 09/17/2011 Document Reviewed: 08/20/2011 [facility] Patient Information 2014 [place], [place].   Postpartum Depression and Baby Blues  The postpartum period begins right after the birth of a baby. During this time, there is often a great amount of joy and excitement. It is also a time of considerable changes in the life of the parent(s). Regardless of how many times a mother gives birth, each child brings new challenges and dynamics to the family. It is not unusual to have feelings of excitement accompanied by confusing shifts in moods, emotions, and thoughts. All mothers are at risk of developing postpartum depression or the "baby blues." These mood changes can occur right after giving birth, or they may occur many months after giving birth. The baby blues or postpartum depression can be mild or severe. Additionally, postpartum depression can resolve rather quickly, or it can be a long-term condition. CAUSES Elevated hormones and their rapid decline are  thought to be a main cause of postpartum depression and the baby blues. There are a number of hormones that radically change during and after pregnancy. Estrogen and progesterone usually decrease immediately after delivering your baby. The level of thyroid hormone and various cortisol steroids also rapidly drop. Other factors that play a major role in these changes include major life events and genetics.  RISK FACTORS If you have any of the following risks for the baby blues or postpartum depression, know what symptoms to watch out for during the postpartum period. Risk factors that may increase the likelihood of getting the baby blues or postpartum depression include: 1. Havinga personal or family history of depression. 2. Having depression while being pregnant. 3. Having premenstrual or oral contraceptive-associated mood issues. 4. Having exceptional life stress. 5. Having marital conflict. 6. Lacking a social support network. 7. Having a baby with special needs. 8. Having health problems such as diabetes. SYMPTOMS Baby blues symptoms include:  Brief fluctuations in mood, such as going from extreme happiness to sadness.  Decreased concentration.  Difficulty sleeping.  Crying spells, tearfulness.  Irritability.  Anxiety. Postpartum depression symptoms typically begin within the first month after giving birth. These symptoms include:  Difficulty sleeping or excessive sleepiness.  Marked weight loss.  Agitation.  Feelings of worthlessness.  Lack of interest in activity or food. Postpartum psychosis is a very concerning condition and can be dangerous. Fortunately, it is rare. Displaying any of the following symptoms is cause for immediate medical attention. Postpartum psychosis symptoms include:  Hallucinations and delusions.  Bizarre or disorganized behavior.  Confusion or disorientation. DIAGNOSIS  A diagnosis is made by an evaluation  of your symptoms. There are no  medical or lab tests that lead to a diagnosis, but there are various questionnaires that a caregiver may use to identify those with the baby blues, postpartum depression, or psychosis. Often times, a screening tool called the [place] Postnatal Depression Scale is used to diagnose depression in the postpartum period.  TREATMENT The baby blues usually goes away on its own in 1 to 2 weeks. Social support is often all that is needed. You should be encouraged to get adequate sleep and rest. Occasionally, you may be given medicines to help you sleep.  Postpartum depression requires treatment as it can last several months or longer if it is not treated. Treatment may include individual or group therapy, medicine, or both to address any social, physiological, and psychological factors that may play a role in the depression. Regular exercise, a healthy diet, rest, and social support may also be strongly recommended.  Postpartum psychosis is more serious and needs treatment right away. Hospitalization is often needed. HOME CARE INSTRUCTIONS  Get as much rest as you can. Nap when the baby sleeps.  Exercise regularly. Some women find yoga and walking to be beneficial.  Eat a balanced and nourishing diet.  Do little things that you enjoy. Have a cup of tea, take a bubble bath, read your favorite magazine, or listen to your favorite music.  Avoid alcohol.  Ask for help with household chores, cooking, grocery shopping, or running errands as needed. Do not try to do everything.  Talk to people close to you about how you are feeling. Get support from your partner, family members, friends, or other new moms.  Try to stay positive in how you think. Think about the things you are grateful for.  Do not spend a lot of time alone.  Only take medicine as directed by your caregiver.  Keep all your postpartum appointments.  Let your caregiver know if you have any concerns. SEEK MEDICAL CARE IF: You are having  a reaction or problems with your medicine. SEEK IMMEDIATE MEDICAL CARE IF:  You have suicidal feelings.  You feel you may harm the baby or someone else. Document Released: 09/27/2003 Document Revised: 03/17/2011 Document Reviewed: 10/29/2010 [facility] Patient Information 2014 [place], [place].     Breastfeeding Deciding to breastfeed is one of the best choices you can make for you and your baby. A change in hormones during pregnancy causes your breast tissue to grow and increases the number and size of your milk ducts. These hormones also allow proteins, sugars, and fats from your blood supply to make breast milk in your milk-producing glands. Hormones prevent breast milk from being released before your baby is born as well as prompt milk flow after birth. Once breastfeeding has begun, thoughts of your baby, as well as his or her sucking or crying, can stimulate the release of milk from your milk-producing glands.  BENEFITS OF BREASTFEEDING For Your Baby  Your first milk (colostrum) helps your baby's digestive system function better.   There are antibodies in your milk that help your baby fight off infections.   Your baby has a lower incidence of asthma, allergies, and sudden infant death syndrome.   The nutrients in breast milk are better for your baby than infant formulas and are designed uniquely for your baby's needs.   Breast milk improves your baby's brain development.   Your baby is less likely to develop other conditions, such as childhood obesity, asthma, or type 2 diabetes mellitus.  For You   Breastfeeding helps to create a very special bond between you and your baby.   Breastfeeding is convenient. Breast milk is always available at the correct temperature and costs nothing.   Breastfeeding helps to burn calories and helps you lose the weight gained during pregnancy.   Breastfeeding makes your uterus contract to its prepregnancy size faster and slows bleeding  (lochia) after you give birth.   Breastfeeding helps to lower your risk of developing type 2 diabetes mellitus, osteoporosis, and breast or ovarian cancer later in life. SIGNS THAT YOUR BABY IS HUNGRY Early Signs of Hunger  Increased alertness or activity.  Stretching.  Movement of the head from side to side.  Movement of the head and opening of the mouth when the corner of the mouth or cheek is stroked (rooting).  Increased sucking sounds, smacking lips, cooing, sighing, or squeaking.  Hand-to-mouth movements.  Increased sucking of fingers or hands. Late Signs of Hunger  Fussing.  Intermittent crying. Extreme Signs of Hunger Signs of extreme hunger will require calming and consoling before your baby will be able to breastfeed successfully. Do not wait for the following signs of extreme hunger to occur before you initiate breastfeeding:   Restlessness.  A loud, strong cry.   Screaming.   BREASTFEEDING BASICS Breastfeeding Initiation  Find a comfortable place to sit or lie down, with your neck and back well supported.  Place a pillow or rolled up blanket under your baby to bring him or her to the level of your breast (if you are seated). Nursing pillows are specially designed to help support your arms and your baby while you breastfeed.  Make sure that your baby's abdomen is facing your abdomen.   Gently massage your breast. With your fingertips, massage from your chest wall toward your nipple in a circular motion. This encourages milk flow. You may need to continue this action during the feeding if your milk flows slowly.  Support your breast with 4 fingers underneath and your thumb above your nipple. Make sure your fingers are well away from your nipple and your baby's mouth.   Stroke your baby's lips gently with your finger or nipple.   When your baby's mouth is open wide enough, quickly bring your baby to your breast, placing your entire nipple and as much of  the colored area around your nipple (areola) as possible into your baby's mouth.   More areola should be visible above your baby's upper lip than below the lower lip.   Your baby's tongue should be between his or her lower gum and your breast.   Ensure that your baby's mouth is correctly positioned around your nipple (latched). Your baby's lips should create a seal on your breast and be turned out (everted).  It is common for your baby to suck about 2-3 minutes in order to start the flow of breast milk. Latching Teaching your baby how to latch on to your breast properly is very important. An improper latch can cause nipple pain and decreased milk supply for you and poor weight gain in your baby. Also, if your baby is not latched onto your nipple properly, he or she may swallow some air during feeding. This can make your baby fussy. Burping your baby when you switch breasts during the feeding can help to get rid of the air. However, teaching your baby to latch on properly is still the best way to prevent fussiness from swallowing air while breastfeeding. Signs that your  baby has successfully latched on to your nipple:    Silent tugging or silent sucking, without causing you pain.   Swallowing heard between every 3-4 sucks.    Muscle movement above and in front of his or her ears while sucking.  Signs that your baby has not successfully latched on to nipple:   Sucking sounds or smacking sounds from your baby while breastfeeding.  Nipple pain. If you think your baby has not latched on correctly, slip your finger into the corner of your baby's mouth to break the suction and place it between your baby's gums. Attempt breastfeeding initiation again. Signs of Successful Breastfeeding Signs from your baby:   A gradual decrease in the number of sucks or complete cessation of sucking.   Falling asleep.   Relaxation of his or her body.   Retention of a small amount of milk in his or  her mouth.   Letting go of your breast by himself or herself. Signs from you:  Breasts that have increased in firmness, weight, and size 1-3 hours after feeding.   Breasts that are softer immediately after breastfeeding.  Increased milk volume, as well as a change in milk consistency and color by the fifth day of breastfeeding.   Nipples that are not sore, cracked, or bleeding. Signs That Your Pecola Leisure is Getting Enough Milk  Wetting at least 3 diapers in a 24-hour period. The urine should be clear and pale yellow by age 1 days.  At least 3 stools in a 24-hour period by age 1 days. The stool should be soft and yellow.  At least 3 stools in a 24-hour period by age 12 days. The stool should be seedy and yellow.  No loss of weight greater than 10% of birth weight during the first 7 days of age.  Average weight gain of 4-7 ounces (113-198 g) per week after age 23 days.  Consistent daily weight gain by age 1 days, without weight loss after the age of 2 weeks. After a feeding, your baby may spit up a small amount. This is common. BREASTFEEDING FREQUENCY AND DURATION Frequent feeding will help you make more milk and can prevent sore nipples and breast engorgement. Breastfeed when you feel the need to reduce the fullness of your breasts or when your baby shows signs of hunger. This is called "breastfeeding on demand." Avoid introducing a pacifier to your baby while you are working to establish breastfeeding (the first 4-6 weeks after your baby is born). After this time you may choose to use a pacifier. Research has shown that pacifier use during the first year of a baby's life decreases the risk of sudden infant death syndrome (SIDS). Allow your baby to feed on each breast as long as he or she wants. Breastfeed until your baby is finished feeding. When your baby unlatches or falls asleep while feeding from the first breast, offer the second breast. Because newborns are often sleepy in the first few  weeks of life, you may need to awaken your baby to get him or her to feed. Breastfeeding times will vary from baby to baby. However, the following rules can serve as a guide to help you ensure that your baby is properly fed:  Newborns (babies 45 weeks of age or younger) may breastfeed every 1-3 hours.  Newborns should not go longer than 3 hours during the day or 5 hours during the night without breastfeeding.  You should breastfeed your baby a minimum of 8 times in  a 24-hour period until you begin to introduce solid foods to your baby at around 74 months of age. BREAST MILK PUMPING Pumping and storing breast milk allows you to ensure that your baby is exclusively fed your breast milk, even at times when you are unable to breastfeed. This is especially important if you are going back to work while you are still breastfeeding or when you are not able to be present during feedings. Your lactation consultant can give you guidelines on how long it is safe to store breast milk.  A breast pump is a machine that allows you to pump milk from your breast into a sterile bottle. The pumped breast milk can then be stored in a refrigerator or freezer. Some breast pumps are operated by hand, while others use electricity. Ask your lactation consultant which type will work best for you. Breast pumps can be purchased, but some hospitals and breastfeeding support groups lease breast pumps on a monthly basis. A lactation consultant can teach you how to hand express breast milk, if you prefer not to use a pump.  CARING FOR YOUR BREASTS WHILE YOU BREASTFEED Nipples can become dry, cracked, and sore while breastfeeding. The following recommendations can help keep your breasts moisturized and healthy:  Avoid using soap on your nipples.   Wear a supportive bra. Although not required, special nursing bras and tank tops are designed to allow access to your breasts for breastfeeding without taking off your entire bra or top.  Avoid wearing underwire-style bras or extremely tight bras.  Air dry your nipples for 3-13minutes after each feeding.   Use only cotton bra pads to absorb leaked breast milk. Leaking of breast milk between feedings is normal.   Use lanolin on your nipples after breastfeeding. Lanolin helps to maintain your skin's normal moisture barrier. If you use pure lanolin, you do not need to wash it off before feeding your baby again. Pure lanolin is not toxic to your baby. You may also hand express a few drops of breast milk and gently massage that milk into your nipples and allow the milk to air dry. In the first few weeks after giving birth, some women experience extremely full breasts (engorgement). Engorgement can make your breasts feel heavy, warm, and tender to the touch. Engorgement peaks within 3-5 days after you give birth. The following recommendations can help ease engorgement:  Completely empty your breasts while breastfeeding or pumping. You may want to start by applying warm, moist heat (in the shower or with warm water-soaked hand towels) just before feeding or pumping. This increases circulation and helps the milk flow. If your baby does not completely empty your breasts while breastfeeding, pump any extra milk after he or she is finished.  Wear a snug bra (nursing or regular) or tank top for 1-2 days to signal your body to slightly decrease milk production.  Apply ice packs to your breasts, unless this is too uncomfortable for you.  Make sure that your baby is latched on and positioned properly while breastfeeding. If engorgement persists after 48 hours of following these recommendations, contact your health care provider or a Advertising copywriter. OVERALL HEALTH CARE RECOMMENDATIONS WHILE BREASTFEEDING  Eat healthy foods. Alternate between meals and snacks, eating 3 of each per day. Because what you eat affects your breast milk, some of the foods may make your baby more irritable than  usual. Avoid eating these foods if you are sure that they are negatively affecting your baby.  Drink milk,  fruit juice, and water to satisfy your thirst (about 10 glasses a day).   Rest often, relax, and continue to take your prenatal vitamins to prevent fatigue, stress, and anemia.  Continue breast self-awareness checks.  Avoid chewing and smoking tobacco.  Avoid alcohol and drug use. Some medicines that may be harmful to your baby can pass through breast milk. It is important to ask your health care provider before taking any medicine, including all over-the-counter and prescription medicine as well as vitamin and herbal supplements. It is possible to become pregnant while breastfeeding. If birth control is desired, ask your health care provider about options that will be safe for your baby. SEEK MEDICAL CARE IF:   You feel like you want to stop breastfeeding or have become frustrated with breastfeeding.  You have painful breasts or nipples.  Your nipples are cracked or bleeding.  Your breasts are red, tender, or warm.  You have a swollen area on either breast.  You have a fever or chills.  You have nausea or vomiting.  You have drainage other than breast milk from your nipples.  Your breasts do not become full before feedings by the fifth day after you give birth.  You feel sad and depressed.  Your baby is too sleepy to eat well.  Your baby is having trouble sleeping.   Your baby is wetting less than 3 diapers in a 24-hour period.  Your baby has less than 3 stools in a 24-hour period.  Your baby's skin or the white part of his or her eyes becomes yellow.   Your baby is not gaining weight by 815 days of age. SEEK IMMEDIATE MEDICAL CARE IF:   Your baby is overly tired (lethargic) and does not want to wake up and feed.  Your baby develops an unexplained fever. Document Released: 12/23/2004 Document Revised: 12/28/2012 Document Reviewed: 06/16/2012 [facility]  Patient Information 2015 [employer] [place], [place]. This information is not intended to replace advice given to you by your health care provider. Make sure you discuss any questions you have with your health care provider.

## 2014-12-31 NOTE — Progress Notes (Signed)
Discharge education complete, script given, discharge instructions and follow up appointment discussed. Patient encouraged to ambulate, stay hydrated, and drink warm fluids to help with gas pain. Patient set up with and taught how to use sitz bath. Provided patient with bottle of motrin from pharmacy; infant will be baby patient.

## 2014-12-31 NOTE — Lactation Note (Signed)
This note was copied from the chart of Norma Collier. Lactation Consultation Note  Patient Name: Norma Collier MCEYE'M Date: 12/31/2014 Reason for consult: Follow-up assessment;Other (Comment) (early term - 4 % weight loss, -6-7.1 oz, breast /bottle /and EBM )  Baby is 17 hours old and has been latching at the breast and supplementing with formula to start and EBM . Per mom has only pumped  X 2 since DEBP was set up yesterday.  LC reviewed the potential feeding behavior of an early term infant.  LC recommended to post  pump after 4 feedings a day 10 -15 mins to enhance milk supply, save and feed EBM back to baby.  Discussed sore nipple and engorgement prevention and tx.  Per mom will be getting a DEBP from her cousin. ( also has the Medela kit from the hospital )  Mom was sure what the mae brand is lung cancer instructed mom on to cut down the tubes to fit the pump if it is Medela. Mom aware that the baby needs to feed at least every 3 hours and if the baby is to sluggish to latch - try and appetizer 1st from a bottle  And then to the breast , if the baby still is sluggish , feed from a bottle for that feeding and pump for 15 -20 mins.  LC referred to the Baby and me booklet.  Mother informed of post-discharge support and given phone number to the lactation department, including services for phone call assistance; out-patient appointments; and breastfeeding support group. List of other breastfeeding resources in the community given in the handout. Encouraged mother to call for problems or concerns related to breastfeeding.   Maternal Data    Feeding    LATCH Score/Interventions                Intervention(s): Breastfeeding basics reviewed (see LC note )     Lactation Tools Discussed/Used Tools: Pump Breast pump type: Double-Electric Breast Pump (per mom has pumped twice since yesterday when set up )   Consult Status Consult Status: Complete Date:  12/31/14    Myer Haff 12/31/2014, 11:33 AM

## 2015-01-01 ENCOUNTER — Ambulatory Visit: Payer: Self-pay

## 2015-01-01 NOTE — Lactation Note (Signed)
This note was copied from the chart of Boy Saint LuciaVictoria Salaz. Lactation Consultation Note  Follow up visit made.  Mom states she just finished feeding baby.  She denies any difficulty or pain with latch.  Breasts are becoming full.  She does have a DEBP at home.  Recommended she follow plan given yesterday and continue post pumping 4-6 times in 24 hours.  Instructed to give EBM back to baby.  Reviewed supply and demand and importance of pumping if she does not breastfeed and gives formula.  Discharge instructions given including engorgement treatment.  Outpatient lactation services and support information reviewed and encouraged.  Patient Name: Boy Lynnae JanuaryVictoria Alton ZOXWR'UToday's Date: 01/01/2015     Maternal Data    Feeding    LATCH Score/Interventions                      Lactation Tools Discussed/Used     Consult Status      Huston FoleyMOULDEN, Joann Kulpa S 01/01/2015, 10:46 AM

## 2015-07-11 IMAGING — US US ABDOMEN LIMITED
1 series · 14 of 25 positions shown · non-contrast
Comparison: None.

CLINICAL DATA: Epigastric pain radiates to diffuse abdomen x 4 days
intermittently. Pt is a caregiver and moves patients daily. Nausea
and diarrhea intermittently over last 2 days

EXAM:
US ABDOMEN LIMITED - RIGHT UPPER QUADRANT

[Series 1: us abdomen limited · 0.18mm/px · 14 of 58 slices shown]
[im 1/58]
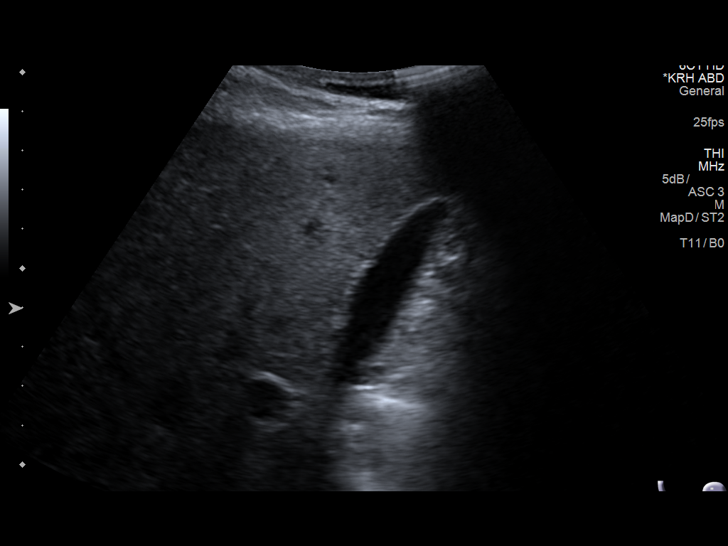
[im 5/58]
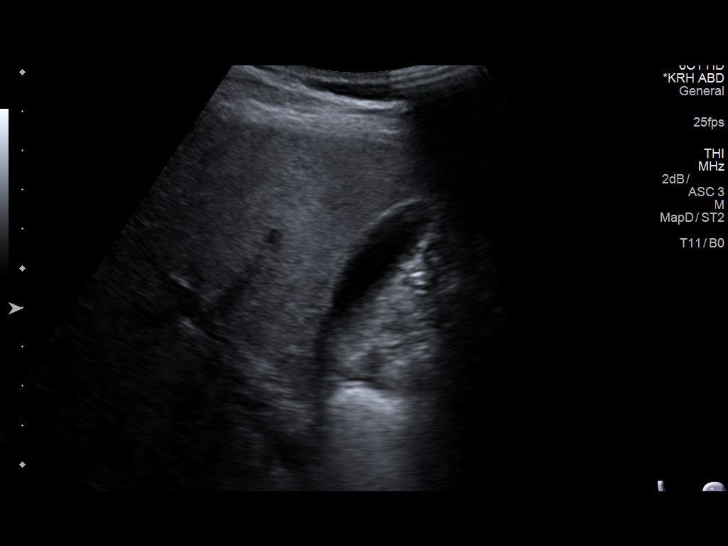
[im 10/58]
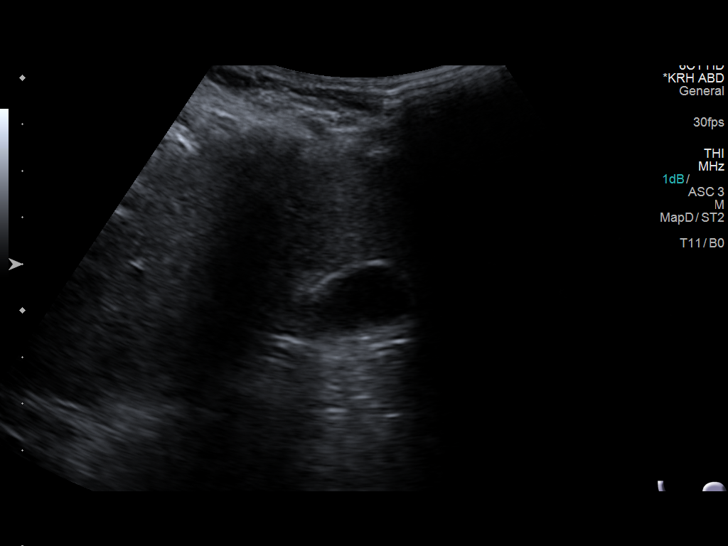
[im 15/58]
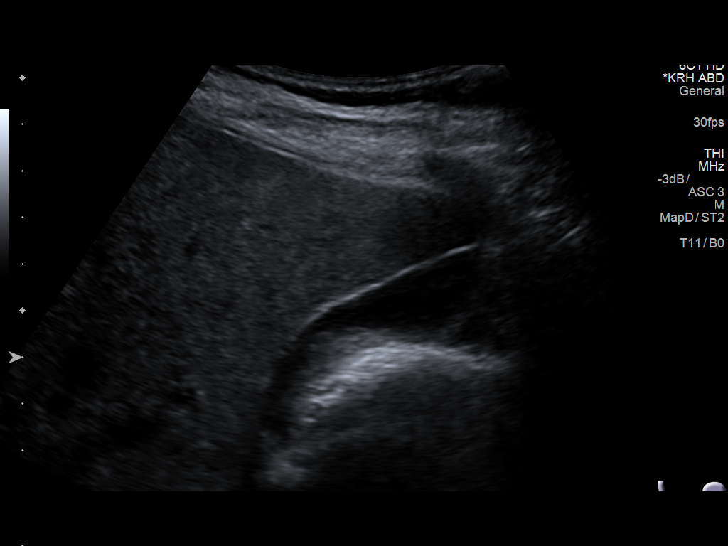
[im 20/58]
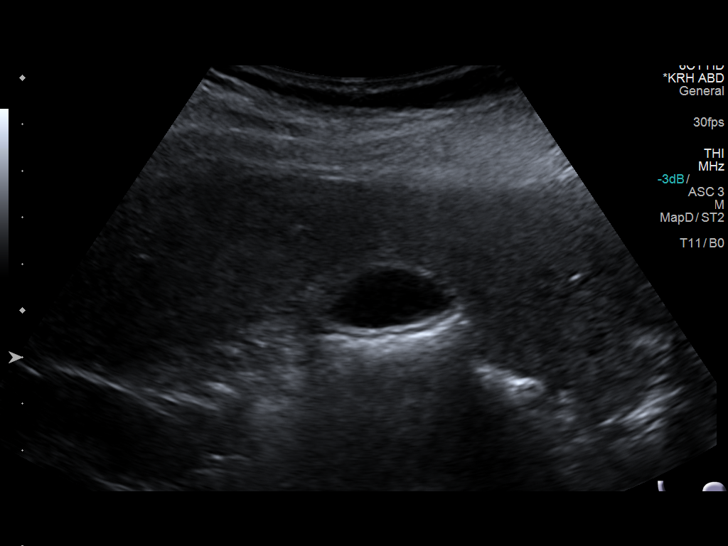
[im 22/58]
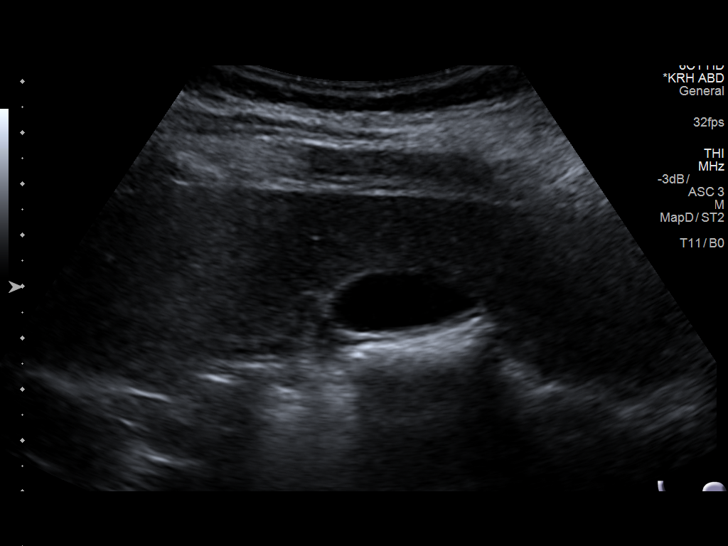
[im 27/58]
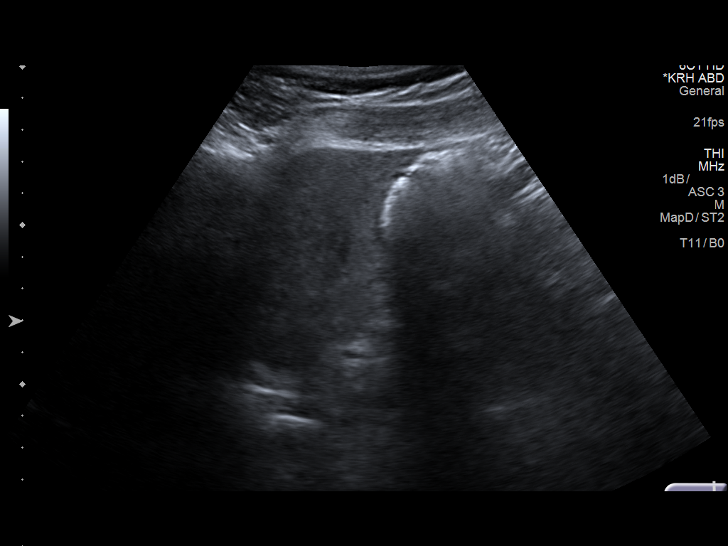
[im 31/58]
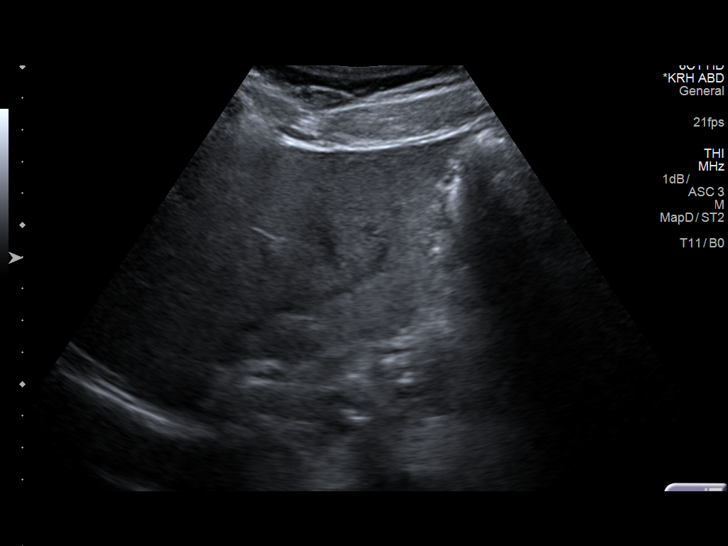
[im 36/58]
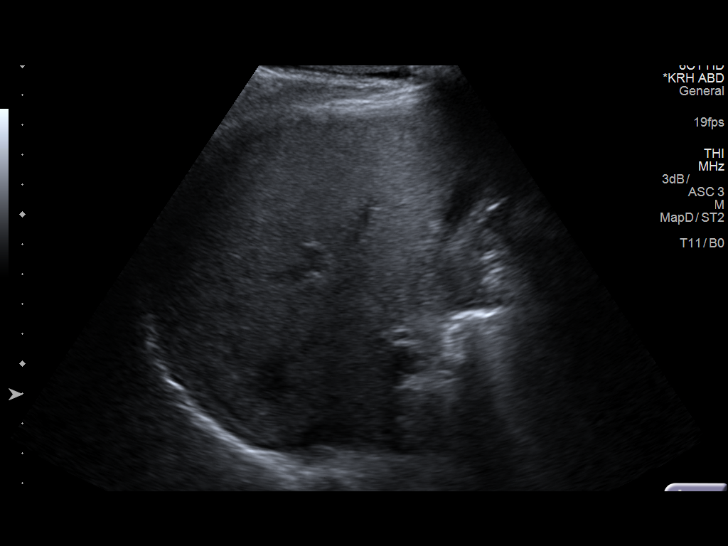
[im 39/58]
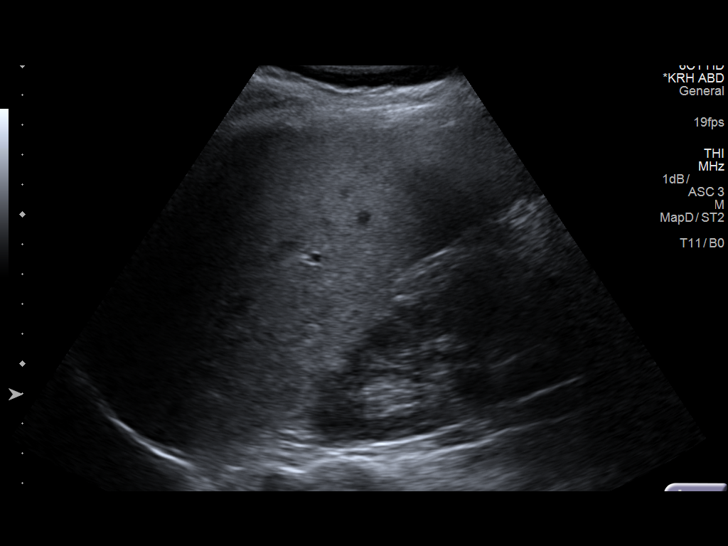
[im 43/58]
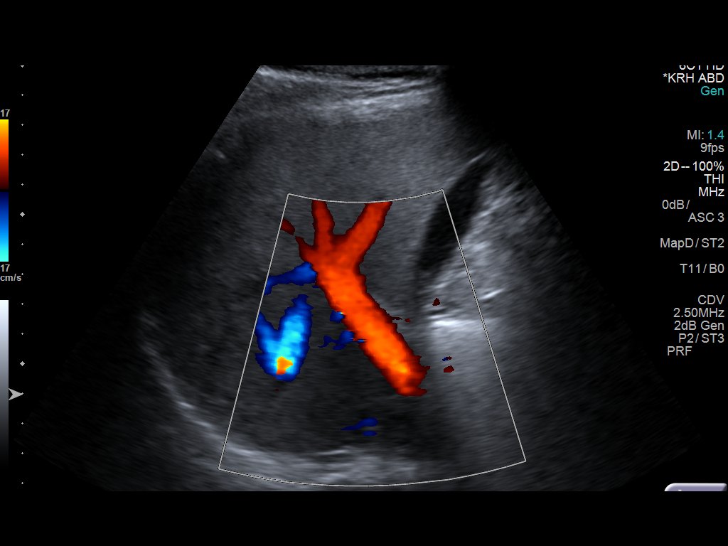
[im 48/58]
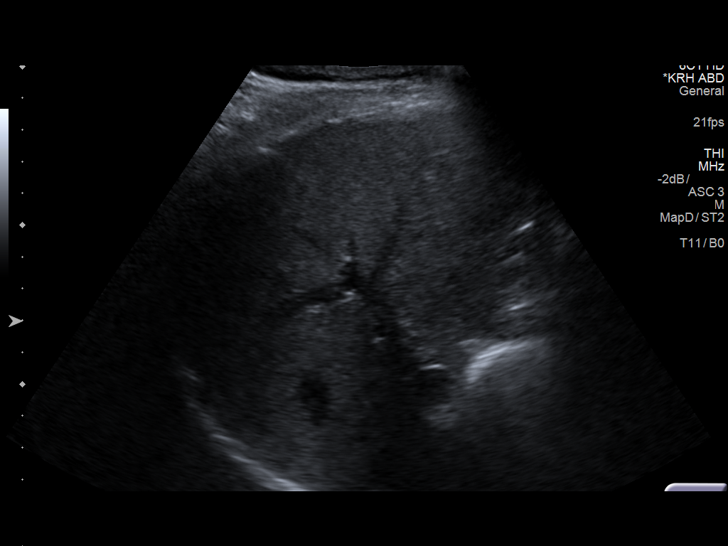
[im 53/58]
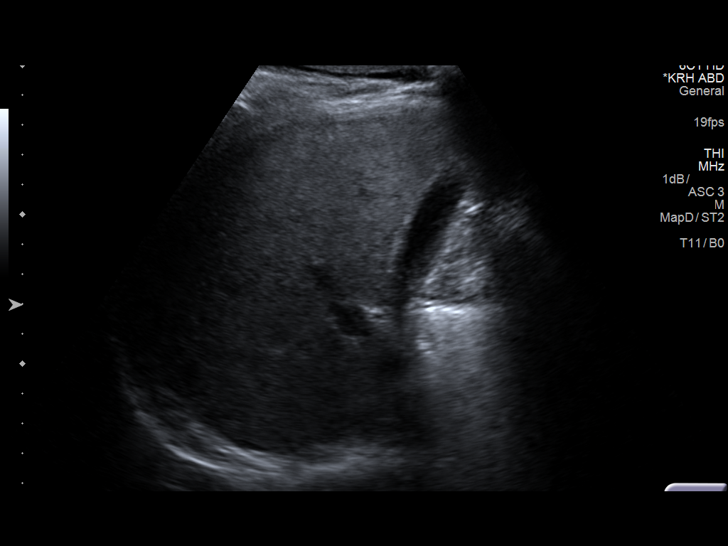
[im 58/58]
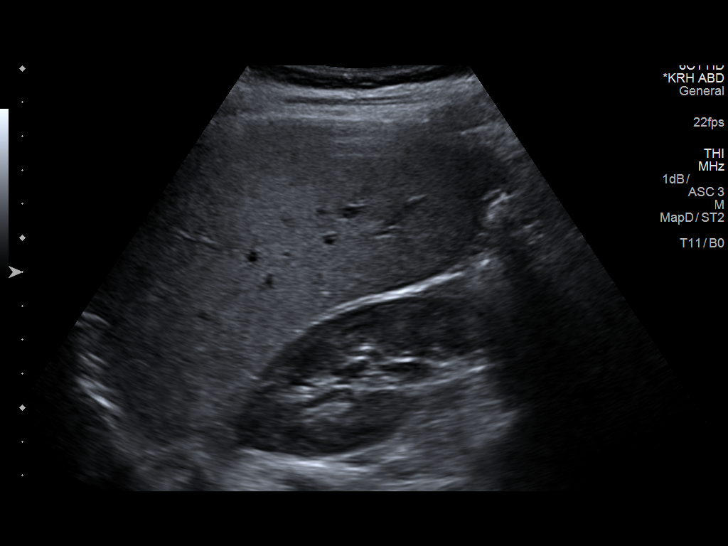

[14 of 25 positions shown; findings below may reference images not displayed]

FINDINGS: Gallbladder:

No gallstones or wall thickening visualized. No sonographic Murphy
sign noted by sonographer.

Common bile duct:

Diameter: 1.3 mm

Liver:

Diffusely echogenic indicating fatty infiltration. No focal liver
abnormality is seen.
IMPRESSION: 1. No acute findings. No evidence of cholecystitis. No gallstones
seen.
2. Fatty infiltration of the liver.

## 2016-03-08 ENCOUNTER — Encounter (HOSPITAL_BASED_OUTPATIENT_CLINIC_OR_DEPARTMENT_OTHER): Payer: Self-pay | Admitting: Emergency Medicine

## 2016-03-08 ENCOUNTER — Emergency Department (HOSPITAL_BASED_OUTPATIENT_CLINIC_OR_DEPARTMENT_OTHER): Payer: Medicaid Other

## 2016-03-08 ENCOUNTER — Emergency Department (HOSPITAL_BASED_OUTPATIENT_CLINIC_OR_DEPARTMENT_OTHER)
Admission: EM | Admit: 2016-03-08 | Discharge: 2016-03-08 | Disposition: A | Discharge status: Home / Self Care | Payer: Medicaid Other | Attending: Physician Assistant | Admitting: Physician Assistant

## 2016-03-08 DIAGNOSIS — Z79899 Other long term (current) drug therapy: Secondary | ICD-10-CM | POA: Insufficient documentation

## 2016-03-08 DIAGNOSIS — R1013 Epigastric pain: Secondary | ICD-10-CM

## 2016-03-08 DIAGNOSIS — N760 Acute vaginitis: Secondary | ICD-10-CM | POA: Diagnosis not present

## 2016-03-08 DIAGNOSIS — B9689 Other specified bacterial agents as the cause of diseases classified elsewhere: Secondary | ICD-10-CM

## 2016-03-08 LAB — COMPREHENSIVE METABOLIC PANEL
ALBUMIN: 4.1 g/dL (ref 3.5–5.0)
ALK PHOS: 101 U/L (ref 38–126)
ALT: 20 U/L (ref 14–54)
AST: 26 U/L (ref 15–41)
Anion gap: 6 (ref 5–15)
BUN: 12 mg/dL (ref 6–20)
CO2: 30 mmol/L (ref 22–32)
Calcium: 9.1 mg/dL (ref 8.9–10.3)
Chloride: 103 mmol/L (ref 101–111)
Creatinine, Ser: 0.79 mg/dL (ref 0.44–1.00)
GFR calc Af Amer: >60 mL/min (ref >60)
GFR calc non Af Amer: >60 mL/min (ref >60)
GLUCOSE: 81 mg/dL (ref 65–99)
POTASSIUM: 3.8 mmol/L (ref 3.5–5.1)
SODIUM: 139 mmol/L (ref 135–145)
Total Bilirubin: 0.3 mg/dL (ref 0.3–1.2)
Total Protein: 7.7 g/dL (ref 6.5–8.1)

## 2016-03-08 LAB — URINALYSIS, MICROSCOPIC (REFLEX): RBC / HPF: NONE SEEN RBC/hpf (ref 0–5)

## 2016-03-08 LAB — URINALYSIS, ROUTINE W REFLEX MICROSCOPIC
Bilirubin Urine: NEGATIVE
Glucose, UA: NEGATIVE mg/dL
Hgb urine dipstick: NEGATIVE
Ketones, ur: NEGATIVE mg/dL
NITRITE: NEGATIVE
Protein, ur: NEGATIVE mg/dL
SPECIFIC GRAVITY, URINE: 1.018 (ref 1.005–1.030)
pH: 6.5 (ref 5.0–8.0)

## 2016-03-08 LAB — CBC WITH DIFFERENTIAL/PLATELET
BASOS ABS: 0 10*3/uL (ref 0.0–0.1)
BASOS PCT: 1 %
Eosinophils Absolute: 0.1 10*3/uL (ref 0.0–0.7)
Eosinophils Relative: 1 %
HEMATOCRIT: 39.2 % (ref 36.0–46.0)
Hemoglobin: 12.8 g/dL (ref 12.0–15.0)
LYMPHS PCT: 25 %
Lymphs Abs: 1.2 10*3/uL (ref 0.7–4.0)
MCH: 27.1 pg (ref 26.0–34.0)
MCHC: 32.7 g/dL (ref 30.0–36.0)
MCV: 82.9 fL (ref 78.0–100.0)
Monocytes Absolute: 0.6 10*3/uL (ref 0.1–1.0)
Monocytes Relative: 12 %
NEUTROS ABS: 3 10*3/uL (ref 1.7–7.7)
Neutrophils Relative %: 61 %
PLATELETS: 250 10*3/uL (ref 150–400)
RBC: 4.73 MIL/uL (ref 3.87–5.11)
RDW: 12.9 % (ref 11.5–15.5)
WBC: 4.9 10*3/uL (ref 4.0–10.5)

## 2016-03-08 LAB — WET PREP, GENITAL
Sperm: NONE SEEN
TRICH WET PREP: NONE SEEN
YEAST WET PREP: NONE SEEN

## 2016-03-08 LAB — LIPASE, BLOOD: Lipase: 40 U/L (ref 11–51)

## 2016-03-08 LAB — PREGNANCY, URINE: PREG TEST UR: NEGATIVE

## 2016-03-08 MED ORDER — CEPHALEXIN 500 MG PO CAPS: 500.0000 mg | ORAL_CAPSULE | Freq: Four times a day (QID) | ORAL | 0 refills | Status: DC

## 2016-03-08 MED ORDER — ONDANSETRON HCL 4 MG/2ML IJ SOLN
4.0000 mg | Freq: Once | INTRAMUSCULAR | Status: AC
  Administered 2016-03-08: 4 mg via INTRAVENOUS
  Filled 2016-03-08: qty 2

## 2016-03-08 MED ORDER — METRONIDAZOLE 500 MG PO TABS: 500.0000 mg | ORAL_TABLET | Freq: Two times a day (BID) | ORAL | 0 refills | Status: DC

## 2016-03-08 NOTE — ED Provider Notes (Signed)
[facility] Note   CSN: [id number] Arrival date & time: 03/08/16  1159     History   Chief Complaint Chief Complaint  Patient presents with  . Abdominal Pain    HPI Norma Collier is a 26 y.o. female.  The history is provided by the patient and medical records. No language interpreter was used.  Abdominal Pain   Associated symptoms include diarrhea and nausea. Pertinent negatives include fever, vomiting, dysuria and headaches.   Norma Collier is a 26 y.o. female  who presents to the Emergency Department complaining of aching epigastric pain x 3 days associated with nausea and one episode of loose stool today. No alleviating or aggravating factors noted. No association with foods. Intermittently aching right sided back pain. No vomiting, fevers. Patient does endorse vaginal discharge which she states is normal for her. No medication taken prior to arrival for symptoms. No new foods or allergies. Denies NSAID use.   Past Medical History:  Diagnosis Date  . Anxiety     Patient Active Problem List   Diagnosis Date Noted  . Normal labor 12/29/2014  . SVD (spontaneous vaginal delivery) 12/29/2014  . Periurethral abrasion, delivered, current hospitalization 12/29/2014    Past Surgical History:  Procedure Laterality Date  . ANKLE SURGERY    . TONSILLECTOMY    . WISDOM TOOTH EXTRACTION      OB History    Gravida Para Term Preterm AB Living   1 1 1     1    SAB TAB Ectopic Multiple Live Births         0 1       Home Medications    Prior to Admission medications   Medication Sig Start Date End Date Taking? Authorizing Provider  ibuprofen (ADVIL,MOTRIN) 600 MG tablet Take 1 tablet (600 mg total) by mouth every 6 (six) hours. 12/31/14   Venus Standard, CNM  oxyCODONE-acetaminophen (PERCOCET/ROXICET) 5-325 MG tablet Take 1-2 tablets by mouth every 6 (six) hours as needed (for pain scale greater than or equal to 4 and less than 7). 12/31/14   Venus  Standard, CNM  Prenatal Vit-Fe Fumarate-FA (PRENATAL MULTIVITAMIN) TABS tablet Take 1 tablet by mouth daily at 12 noon.    Historical Provider, MD  senna-docusate (SENOKOT-S) 8.6-50 MG tablet Take 2 tablets by mouth daily. 12/31/14   Venus Standard, CNM    Family History No family history on file.  Social History Social History  Substance Use Topics  . Smoking status: Never Smoker  . Smokeless tobacco: Never Used  . Alcohol use Yes     Comment: rare, when not pregnant      Allergies   Shellfish allergy   Review of Systems Review of Systems  Constitutional: Negative for chills and fever.  HENT: Negative for congestion.   Eyes: Negative for visual disturbance.  Respiratory: Negative for cough and shortness of breath.   Gastrointestinal: Positive for abdominal pain, diarrhea and nausea. Negative for vomiting.  Genitourinary: Positive for vaginal discharge. Negative for dysuria.  Musculoskeletal: Negative for back pain and neck pain.  Skin: Negative for rash.  Neurological: Negative for headaches.     Physical Exam Updated Vital Signs BP 112/63 (BP Location: Left Arm)   Pulse 75   Temp 98.2 F (36.8 C) (Oral)   Resp 19   Ht 5\' 7"  (1.702 m)   Wt 77.1 kg   LMP 02/20/2016 (Exact Date)   SpO2 100%   BMI 26.63 kg/m   Physical Exam  Constitutional: She is oriented to person, place, and time. She appears well-developed and well-nourished. No distress.  HENT:  Head: Normocephalic and atraumatic.  Cardiovascular: Normal rate, regular rhythm and normal heart sounds.   No murmur heard. Pulmonary/Chest: Effort normal and breath sounds normal. No respiratory distress.  Abdominal: Soft. Bowel sounds are normal. She exhibits no distension. There is tenderness.  Tenderness to palpation of the epigastrium and right upper quadrant. Negative Murphy's. No rebound or guarding. No CVA tenderness.  Genitourinary:  Genitourinary Comments: Chaperone present for exam. + vaginal  discharge. No bleeding within vaginal vault. No adnexal masses, tenderness, or fullness. No CMT.  Neurological: She is alert and oriented to person, place, and time.  Skin: Skin is warm and dry.  Nursing note and vitals reviewed.    ED Treatments / Results  Labs (all labs ordered are listed, but only abnormal results are displayed) Labs Reviewed  URINALYSIS, ROUTINE W REFLEX MICROSCOPIC - Abnormal; Notable for the following:       Result Value   Leukocytes, UA MODERATE (*)    All other components within normal limits  URINALYSIS, MICROSCOPIC (REFLEX) - Abnormal; Notable for the following:    Bacteria, UA MANY (*)    Squamous Epithelial / LPF 0-5 (*)    All other components within normal limits  WET PREP, GENITAL  PREGNANCY, URINE  CBC WITH DIFFERENTIAL/PLATELET  LIPASE, BLOOD  COMPREHENSIVE METABOLIC PANEL  GC/CHLAMYDIA PROBE AMP ([place]) NOT [facility]    EKG  EKG Interpretation None       Radiology [place] Abdomen Limited Ruq  Result Date: 03/08/2016 CLINICAL DATA:  Epigastric pain radiates to diffuse abdomen x 4 days intermittently. Pt is a caregiver and moves patients daily. Nausea and diarrhea intermittently over last 2 days EXAM: US ABDOMEN LIMITED - RIGHT UPPER QUADRANT COMPARISON:  None. FINDINGS: Gallbladder: No gallstones or wall thickening visualized. No sonographic Murphy sign noted by sonographer. Common bile duct: Diameter: 1.3 mm Liver: Diffusely echogenic indicating fatty infiltration. No focal liver abnormality is seen. IMPRESSION: 1. No acute findings. No evidence of cholecystitis. No gallstones seen. 2. Fatty infiltration of the liver. Electronically Signed   By: Bary RichardStan  Maynard M.D.   On: 03/08/2016 14:34    Procedures Procedures (including critical care time)  Medications Ordered in ED Medications  ondansetron (ZOFRAN) injection 4 mg (4 mg Intravenous Given 03/08/16 1330)     Initial Impression / Assessment and Plan / ED Course  I have reviewed the  triage vital signs and the nursing notes.  Pertinent labs & imaging results that were available during my care of the patient were reviewed by me and considered in my medical decision making (see chart for details).    Norma Collier is a 26 y.o. female who presents to ED for epigastric abdominal pain and nausea 3 days. On exam, patient has tenderness palpation of the epigastrium and right upper quadrant. No peritoneal signs. Negative Murphy's.  CBC, CMP, lipase within normal limits. Right upper quadrant ultrasound negative for acute findings. UA shows moderate leukocytes, 6-30 wbc's, many bacteria. Patient reevaluated and urine results discussed. Patient again denies dysuria, but does endorse left-sided low back pain and intermittent achy suprapubic pain. Given vaginal discharge, pelvic exam was performed which shows vaginal discharge. G&C obtained. Wet prep shows clue cells and many wbc's. Patient re-evaluated with non-surgical abdomen. Patient is tolerating PO with no episodes of emesis following Zofran administration. ? UTI vs. BV. Will treat with flagyl and keflex. Abdominal return precautions  discussed and all questions answered.   Final Clinical Impressions(s) / ED Diagnoses   Final diagnoses:  Epigastric abdominal pain    New Prescriptions New Prescriptions   No medications on file     [facility], PA-C 03/08/16 1612    Courteney Randall An, MD 03/09/16 [phone]

## 2016-03-08 NOTE — Discharge Instructions (Signed)
Please take all of your antibiotics until finished! Please call your OBGYN or family physician on Monday morning to schedule a follow up appointment.   Please seek immediate care if you develop any of the following symptoms: The pain does not go away.  You have a fever.  You throw up (vomiting).  You pass bloody or black tarry stools.  There is bright red blood in the stool.  There is rectal pain.  You do not seem to be getting better.  You have any questions or concerns.

## 2016-03-08 NOTE — ED Notes (Signed)
Pt ambulated to restroom. 

## 2016-03-08 NOTE — ED Notes (Signed)
Pt discharged to home NAD.  

## 2016-03-08 NOTE — ED Triage Notes (Signed)
Epigastric pain x 3 days, diarrhea yesterday  And decrease appetite

## 2016-03-10 LAB — GC/CHLAMYDIA PROBE AMP (~~LOC~~) NOT AT ARMC
Chlamydia: POSITIVE — AB
NEISSERIA GONORRHEA: NEGATIVE

## 2016-03-26 ENCOUNTER — Encounter: Payer: Self-pay | Admitting: Physician Assistant

## 2016-03-26 ENCOUNTER — Ambulatory Visit (INDEPENDENT_AMBULATORY_CARE_PROVIDER_SITE_OTHER): Payer: No Typology Code available for payment source | Admitting: Physician Assistant

## 2016-03-26 ENCOUNTER — Other Ambulatory Visit: Payer: Self-pay | Admitting: Physician Assistant

## 2016-03-26 VITALS — BP 118/80 | HR 82 | Temp 98.0°F | Resp 16 | Ht 66.0 in | Wt 175.0 lb

## 2016-03-26 DIAGNOSIS — Z113 Encounter for screening for infections with a predominantly sexual mode of transmission: Secondary | ICD-10-CM

## 2016-03-26 DIAGNOSIS — Z0000 Encounter for general adult medical examination without abnormal findings: Secondary | ICD-10-CM | POA: Diagnosis not present

## 2016-03-26 LAB — WET PREP FOR TRICH, YEAST, CLUE
TRICH WET PREP: NONE SEEN
YEAST WET PREP: NONE SEEN

## 2016-03-26 NOTE — Progress Notes (Signed)
Patient ID: TANYLAH SCHNOEBELEN MRN: [id number], DOB: [birth year], 26 y.o. Date of Encounter: 03/26/2016,   Chief Complaint: Physical (CPE). New Patient  HPI: 26 y.o. y/o female  here for CPE.   She is also being seen as a "New Patient."   She says that she actually came to this office years ago and actually had seen myself as well as Dr. Tanya Nones. She was then in the [employer] for 4 years. Is here to re-establish.  Says that she was in the [employer] for 4 years----- 2012 - 2016. Says that she went to the [place] to have a physical for disability in April 2017. Asked what the disability is for and she says that she had 3 ankle surgeries. Also mental health has PTSD anxiety depression and says that one report mentioned bipolar. Says that she last saw GYN when her baby was 47 months old. She has 1 child -- a boy who is 24 years old. 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 Says that they have classes in building near [place] as well as 1 or 2 on line classes. Says that she recently went to the ER and tested positive for chlamydia and they told her to follow-up to make sure that this had resolved.  No other specific concerns to address today.   Review of Systems: Consitutional: No fever, chills, fatigue, night sweats, lymphadenopathy. No significant/unexplained weight changes. Eyes: No visual changes, eye redness, or discharge. ENT/Mouth: No ear pain, sore throat, nasal drainage, or sinus pain. Cardiovascular: No chest pressure,heaviness, tightness or squeezing, even with exertion. No increased shortness of breath or dyspnea on exertion.No palpitations, edema, orthopnea, PND. Respiratory: No cough, hemoptysis, SOB, or wheezing. Gastrointestinal: No anorexia, dysphagia, reflux, pain, nausea, vomiting, hematemesis, diarrhea, constipation, BRBPR, or melena. Breast: No mass, nodules, bulging, or retraction. No skin changes or  inflammation. No nipple discharge. No lymphadenopathy. Genitourinary: No dysuria, hematuria, incontinence, vaginal discharge, pruritis, burning, abnormal bleeding, or pain. Musculoskeletal: No decreased ROM, No joint pain or swelling. No significant pain in neck, back, or extremities. Skin: No rash, pruritis, or concerning lesions. Neurological: No headache, dizziness, syncope, seizures, tremors, memory loss, coordination problems, or paresthesias. Psychological: No anxiety, depression, hallucinations, SI/HI. Endocrine: No polydipsia, polyphagia, polyuria, or known diabetes.No increased fatigue. No palpitations/rapid heart rate. No significant/unexplained weight change. All other systems were reviewed and are otherwise negative.  Past Medical History:  Diagnosis Date  . Anxiety      Past Surgical History:  Procedure Laterality Date  . ANKLE SURGERY    . TONSILLECTOMY    . WISDOM TOOTH EXTRACTION      Home Meds:  Outpatient Medications Prior to Visit  Medication Sig Dispense Refill  . cephALEXin (KEFLEX) 500 MG capsule Take 1 capsule (500 mg total) by mouth 4 (four) times daily. (Patient not taking: Reported on 03/26/2016) 40 capsule 0  . ibuprofen (ADVIL,MOTRIN) 600 MG tablet Take 1 tablet (600 mg total) by mouth every 6 (six) hours. (Patient not taking: Reported on 03/26/2016) 30 tablet 0  . metroNIDAZOLE (FLAGYL) 500 MG tablet Take 1 tablet (500 mg total) by mouth 2 (two) times daily. (Patient not taking: Reported on 03/26/2016) 14 tablet 0  . oxyCODONE-acetaminophen (PERCOCET/ROXICET) 5-325 MG tablet Take 1-2 tablets by mouth every 6 (six) hours as needed (for pain scale greater than or equal to 4 and less than 7). (Patient not taking: Reported on 03/26/2016) 30 tablet 0  .  Prenatal Vit-Fe Fumarate-FA (PRENATAL MULTIVITAMIN) TABS tablet Take 1 tablet by mouth daily at 12 noon.    . senna-docusate (SENOKOT-S) 8.6-50 MG tablet Take 2 tablets by mouth daily. (Patient not taking: Reported on  03/26/2016) 30 tablet 1   No facility-administered medications prior to visit.     Allergies:  Allergies  Allergen Reactions  . Shellfish Allergy Hives    Social History   Social History  . Marital status: Single    Spouse name: N/A  . Number of children: N/A  . Years of education: N/A   Occupational History  . Not on file.   Social History Main Topics  . Smoking status: Never Smoker  . Smokeless tobacco: Never Used  . Alcohol use Yes     Comment: rare, when not pregnant   . Drug use: No  . Sexual activity: Yes    Birth control/ protection: None   Other Topics Concern  . Not on file   Social History Narrative  . No narrative on file    No family history on file.  Physical Exam: Blood pressure 118/80, pulse 82, temperature 98 F (36.7 C), temperature source Oral, resp. rate 16, height 5\' 6"  (1.676 m), weight 175 lb (79.4 kg), last menstrual period 02/20/2016, SpO2 99 %, unknown if currently breastfeeding., Body mass index is 28.25 kg/m. General: Well developed, well nourished,WF. appears in no acute distress. HEENT: Normocephalic, atraumatic. Conjunctiva pink, sclera non-icteric. Pupils 2 mm constricting to 1 mm, round, regular, and equally reactive to light and accomodation. EOMI. Internal auditory canal clear. TMs with good cone of light and without pathology. Nasal mucosa pink. Nares are without discharge. No sinus tenderness. Oral mucosa pink.  Pharynx without exudate.   Neck: Supple. Trachea midline. No thyromegaly. Full ROM. No lymphadenopathy.No Carotid Bruits. Lungs: Clear to auscultation bilaterally without wheezes, rales, or rhonchi. Breathing is of normal effort and unlabored. Cardiovascular: RRR with S1 S2. No murmurs, rubs, or gallops. Distal pulses 2+ symmetrically. No carotid or abdominal bruits. Breast: Symmetrical. No masses. Nipples without discharge. Abdomen: Soft, non-tender, non-distended with normoactive bowel sounds. No hepatosplenomegaly or  masses. No rebound/guarding. No CVA tenderness. No hernias.  Genitourinary:  External genitalia without lesions. Vaginal mucosa pink.No discharge present. Cervix pink and without discharge. No cervical tenderness.Normal uterus size. No adnexal mass or tenderness.  Pap smear taken Musculoskeletal: Full range of motion and 5/5 strength throughout.  Skin: Warm and moist without erythema, ecchymosis, wounds, or rash. Neuro: A+Ox3. CN II-XII grossly intact. Moves all extremities spontaneously. Full sensation throughout. Normal gait. DTR 2+ throughout upper and lower extremities. Finger to nose intact. Psych:  Responds to questions appropriately with a normal affect.   Assessment/Plan:  26 y.o. y/o female here for CPE 1. Encounter for preventive health examination  A. Screening Labs: CBC, CMET, UA were performed 03/2016 at the hospital. 2016 OB checked labs.  B. Pap: --Check now  C. Screening Mammogram: N/A at this age  D. DEXA/BMD:  N/A at this age  E. Colorectal Cancer Screening: N/A at this age  F. Immunizations: Immunizations are up-to-date through the [employer]. She has these records and will bring them in so we can enter them into our system. Influenza: Tetanus: Pneumococcal: Zostavax:     2. Screening for STDs (sexually transmitted diseases) Reports that she recently was seen in the ER and tested positive for chlamydia and was treated but told to follow-up to recheck to make sure this was regional resolved. - WET PREP FOR TRICH, YEAST, CLUE -  GC/Chlamydia Probe Amp    Signed, [address] [place], [place], [facility] 03/26/2016 3:19 PM

## 2016-03-27 LAB — GC/CHLAMYDIA PROBE AMP
CT PROBE, AMP APTIMA: DETECTED — AB
GC Probe RNA: NOT DETECTED

## 2016-03-28 LAB — PAP THINPREP ASCUS RFLX HPV RFLX TYPE

## 2016-03-31 ENCOUNTER — Encounter: Payer: Self-pay | Admitting: Physician Assistant

## 2016-04-02 ENCOUNTER — Other Ambulatory Visit: Payer: Self-pay

## 2016-04-02 DIAGNOSIS — R87629 Unspecified abnormal cytological findings in specimens from vagina: Secondary | ICD-10-CM

## 2016-04-02 LAB — HPV DNA, RELFEX TYPE 16/18: HPV DNA HIGH RISK: DETECTED — AB

## 2016-04-02 MED ORDER — AZITHROMYCIN 500 MG PO TABS: ORAL_TABLET | ORAL | 0 refills | Status: DC

## 2016-04-04 LAB — HPV TYPE 16/18
HPV Genotype, 16: NOT DETECTED
HPV Genotype, 18: NOT DETECTED

## 2016-04-13 ENCOUNTER — Encounter (HOSPITAL_BASED_OUTPATIENT_CLINIC_OR_DEPARTMENT_OTHER): Payer: Self-pay | Admitting: *Deleted

## 2016-04-13 ENCOUNTER — Emergency Department (HOSPITAL_BASED_OUTPATIENT_CLINIC_OR_DEPARTMENT_OTHER)
Admission: EM | Admit: 2016-04-13 | Discharge: 2016-04-13 | Disposition: A | Discharge status: Home / Self Care | Payer: Medicaid Other | Attending: Emergency Medicine | Admitting: Emergency Medicine

## 2016-04-13 DIAGNOSIS — H66001 Acute suppurative otitis media without spontaneous rupture of ear drum, right ear: Secondary | ICD-10-CM | POA: Insufficient documentation

## 2016-04-13 DIAGNOSIS — H9201 Otalgia, right ear: Secondary | ICD-10-CM | POA: Diagnosis present

## 2016-04-13 HISTORY — DX: Chlamydial infection, unspecified: A74.9

## 2016-04-13 MED ORDER — AMOXICILLIN 500 MG PO CAPS: 500.0000 mg | ORAL_CAPSULE | Freq: Three times a day (TID) | ORAL | 0 refills | Status: DC

## 2016-04-13 NOTE — ED Provider Notes (Signed)
[facility] Provider Note   CSN: [id number] Arrival date & time: 04/13/16  1857  By signing my name below, I, Bing Neighbors., attest that this documentation has been prepared under the direction and in the presence of Rolan Bucco, MD. Electronically signed: Bing Neighbors., ED Scribe. 04/13/16. 7:43 PM.    History   Chief Complaint Chief Complaint  Patient presents with  . Otalgia    HPI Norma Collier is a 26 y.o. female who presents to the Emergency Department complaining of worsening R ear pain with onset x2 days. Pt states that she has had a sinus infection for the past x4 days but for the past x2 days, pt states that she has had worsening R ear pain. She reports nasal congestion, sore throat. She denies any modifying factors. Pt denies fever, chest congestion. Of note, pt is a seen at [facility].  The history is provided by the patient. No language interpreter was used.    Past Medical History:  Diagnosis Date  . Anxiety   . Chlamydia     Patient Active Problem List   Diagnosis Date Noted  . Normal labor 12/29/2014  . SVD (spontaneous vaginal delivery) 12/29/2014  . Periurethral abrasion, delivered, current hospitalization 12/29/2014    Past Surgical History:  Procedure Laterality Date  . ANKLE SURGERY    . TONSILLECTOMY    . WISDOM TOOTH EXTRACTION      OB History    Gravida Para Term Preterm AB Living   SAB TAB Ectopic Multiple Live Births         0 1       Home Medications    Prior to Admission medications   Medication Sig Start Date End Date Taking? Authorizing Provider  amoxicillin (AMOXIL) 500 MG capsule Take 1 capsule (500 mg total) by mouth 3 (three) times daily. 04/13/16   Rolan Bucco, MD  azithromycin (ZITHROMAX) 500 MG tablet Take  2 (two) tablets at one time. 04/02/16   Dorena Bodo, PA-C    Family History History reviewed. No pertinent family history.  Social  History Social History  Substance Use Topics  . Smoking status: Never Smoker  . Smokeless tobacco: Never Used  . Alcohol use Yes     Comment: rare, when not pregnant      Allergies   Shellfish allergy   Review of Systems Review of Systems  Constitutional: Negative for chills, diaphoresis, fatigue and fever.  HENT: Positive for congestion, ear pain and sore throat. Negative for rhinorrhea and sneezing.   Eyes: Negative.   Respiratory: Negative for cough, chest tightness and shortness of breath.   Cardiovascular: Negative for chest pain and leg swelling.  Gastrointestinal: Negative for abdominal pain, blood in stool, diarrhea, nausea and vomiting.  Genitourinary: Negative for difficulty urinating, flank pain, frequency and hematuria.  Musculoskeletal: Negative for arthralgias and back pain.  Skin: Negative for rash.  Neurological: Negative for dizziness, speech difficulty, weakness, numbness and headaches.     Physical Exam Updated Vital Signs BP 118/61 (BP Location: Right Arm)   Pulse (!) 109   Temp 98.5 F (36.9 C) (Oral)   Resp 18   Ht  (1.676 m)   Wt 170 lb (77.1 kg)   LMP 02/21/2016 (Exact Date) Comment: irregular  SpO2 98%   BMI 27.44 kg/m   Physical Exam  Constitutional: She is oriented to person, place, and time.  She appears well-developed and well-nourished.  HENT:  Head: Normocephalic and atraumatic.  Right Ear: Tympanic membrane is erythematous.  Clear fluid behind the L TM. Cloudy fluid behind the R TM with an erythematous membrane.  Eyes: Pupils are equal, round, and reactive to light.  Neck: Normal range of motion. Neck supple.  Cardiovascular: Normal rate, regular rhythm and normal heart sounds.   Pulmonary/Chest: Effort normal and breath sounds normal. No respiratory distress. She has no wheezes. She has no rales. She exhibits no tenderness.  Abdominal: Soft. Bowel sounds are normal. There is no tenderness. There is no rebound and no guarding.   Musculoskeletal: Normal range of motion. She exhibits no edema.  Lymphadenopathy:    She has no cervical adenopathy.  Neurological: She is alert and oriented to person, place, and time.  Skin: Skin is warm and dry. No rash noted.  Psychiatric: She has a normal mood and affect.     ED Treatments / Results   DIAGNOSTIC STUDIES: Oxygen Saturation is 98% on RA, normal by my interpretation.   COORDINATION OF CARE: 7:43 PM-Discussed next steps with pt. Pt verbalized understanding and is agreeable with the plan.    Labs (all labs ordered are listed, but only abnormal results are displayed) Labs Reviewed - No data to display  EKG  EKG Interpretation None       Radiology No results found.  Procedures Procedures (including critical care time)  Medications Ordered in ED Medications - No data to display   Initial Impression / Assessment and Plan / ED Course  I have reviewed the triage vital signs and the nursing notes.  Pertinent labs & imaging results that were available during my care of the patient were reviewed by me and considered in my medical decision making (see chart for details).     Patient is well-appearing with evidence of right otitis media. There is no suggestions of mastoiditis. She was started on amoxicillin. She was encouraged to follow-up with her PCP to ensure resolution. Return precautions were given.  Final Clinical Impressions(s) / ED Diagnoses   Final diagnoses:  Acute suppurative otitis media of right ear without spontaneous rupture of tympanic membrane, recurrence not specified    New Prescriptions New Prescriptions   AMOXICILLIN (AMOXIL) 500 MG CAPSULE    Take 1 capsule (500 mg total) by mouth 3 (three) times daily.   I personally performed the services described in this documentation, which was scribed in my presence.  The recorded information has been reviewed and considered.     Rolan Bucco, MD 04/13/16 [phone]

## 2016-04-13 NOTE — ED Triage Notes (Signed)
c/o "sinus infection", onset 4-5 days ago, R ear pain got really bad last night. Also reports nasal congestion, sore throat, R and L ear issues, cough, and a little dizziness, (denies: fever). No meds PTA. h/o of similar frequently.

## 2016-04-13 NOTE — ED Notes (Signed)
Alert, NAD, calm, interactive, resps e/u, speaking in clear complete sentences, no dyspnea noted, skin W&D, VSS, c/o facial sinus pain and R ear pain, L ear popping, congestion, mentions "a little to most sx", (denies: sob).

## 2016-08-01 ENCOUNTER — Encounter (HOSPITAL_BASED_OUTPATIENT_CLINIC_OR_DEPARTMENT_OTHER): Payer: Self-pay | Admitting: *Deleted

## 2016-08-01 ENCOUNTER — Emergency Department (HOSPITAL_BASED_OUTPATIENT_CLINIC_OR_DEPARTMENT_OTHER)
Admission: EM | Admit: 2016-08-01 | Discharge: 2016-08-01 | Disposition: A | Discharge status: Home / Self Care | Payer: Medicaid Other | Attending: Emergency Medicine | Admitting: Emergency Medicine

## 2016-08-01 DIAGNOSIS — R0981 Nasal congestion: Secondary | ICD-10-CM

## 2016-08-01 DIAGNOSIS — H9201 Otalgia, right ear: Secondary | ICD-10-CM

## 2016-08-01 MED ORDER — IBUPROFEN 800 MG PO TABS
800.0000 mg | ORAL_TABLET | Freq: Once | ORAL | Status: AC
  Administered 2016-08-01: 800 mg via ORAL
  Filled 2016-08-01: qty 1

## 2016-08-01 MED ORDER — PHENYLEPHRINE HCL 10 MG PO TABS: 10.0000 mg | ORAL_TABLET | ORAL | 0 refills | Status: AC | PRN

## 2016-08-01 MED ORDER — IBUPROFEN 800 MG PO TABS
ORAL_TABLET | ORAL | Status: AC
  Filled 2016-08-01: qty 1

## 2016-08-01 NOTE — Discharge Instructions (Signed)
We prescribe phenylephrine a decongestant that should help with your congestion and pain. Continue to use Ibuprofen and tylenol for pain. If you develop fever, drainage, increase pain please return to the ED.

## 2016-08-01 NOTE — ED Triage Notes (Signed)
Right ear pain x several days.

## 2016-08-01 NOTE — ED Provider Notes (Signed)
[facility] Note   CSN: [id number] Arrival date & time: 08/01/16  1235     History   Chief Complaint Chief Complaint  Patient presents with  . Otalgia    HPI Norma Collier is a 26 y.o. female with no significant past medical history who presents with right ear pain. Patient reports she has been having ear ain for the past three days, with sinus congestion and mild sore throat. Patient reports some clear fluid drainage but denies fever, chills, abdominal pain, shortness of breath and chest pain. Patient has a history a seasonal allergies and was recently treated for otitis media.  HPI  Past Medical History:  Diagnosis Date  . Anxiety   . Chlamydia     Patient Active Problem List   Diagnosis Date Noted  . Normal labor 12/29/2014  . SVD (spontaneous vaginal delivery) 12/29/2014  . Periurethral abrasion, delivered, current hospitalization 12/29/2014    Past Surgical History:  Procedure Laterality Date  . ANKLE SURGERY    . TONSILLECTOMY    . WISDOM TOOTH EXTRACTION      OB History    Gravida Para Term Preterm AB Living   1 1 1     1    SAB TAB Ectopic Multiple Live Births         0 1       Home Medications    Prior to Admission medications   Medication Sig Start Date End Date Taking? Authorizing Provider  phenylephrine (SUDAFED PE CONGESTION) 10 MG TABS tablet Take 1 tablet (10 mg total) by mouth every 4 (four) hours as needed. 08/01/16   Lovena Neighboursiallo, Douglas Smolinsky, MD    Family History History reviewed. No pertinent family history.  Social History Social History  Substance Use Topics  . Smoking status: Never Smoker  . Smokeless tobacco: Never Used  . Alcohol use Yes     Comment: rare, when not pregnant      Allergies   Shellfish allergy   Review of Systems Review of Systems  Constitutional: Negative.   HENT: Positive for ear discharge, ear pain, sinus pain, sinus pressure and sore throat.   Eyes: Negative.   Respiratory:  Negative.   Cardiovascular: Negative.   Gastrointestinal: Negative.   Endocrine: Negative.   Genitourinary: Negative.   Musculoskeletal: Negative.   Skin: Negative.   Allergic/Immunologic: Negative.   Neurological: Negative.   Hematological: Negative.   Psychiatric/Behavioral: Negative.      Physical Exam Updated Vital Signs BP 123/78 (BP Location: Right Arm)   Pulse 65   Temp 98.2 F (36.8 C) (Oral)   Resp 18   Ht 5\' 6"  (1.676 m)   Wt 79.4 kg (175 lb)   LMP 07/18/2016   SpO2 98%   BMI 28.25 kg/m   Physical Exam  Constitutional: She appears well-developed.  HENT:  Head: Normocephalic and atraumatic.  Right Ear: Tympanic membrane and ear canal normal. Tympanic membrane is not injected, not perforated and not bulging.  Left Ear: Hearing normal.     ED Treatments / Results  Labs (all labs ordered are listed, but only abnormal results are displayed) Labs Reviewed - No data to display  EKG  EKG Interpretation None       Radiology No results found.  Procedures Procedures (including critical care time)  Medications Ordered in ED Medications  ibuprofen (ADVIL,MOTRIN) tablet 800 mg (800 mg Oral Given 08/01/16 1318)     Initial Impression / Assessment and Plan / ED Course  I have  reviewed the triage vital signs and the nursing notes.  Pertinent labs & imaging results that were available during my care of the patient were reviewed by me and considered in my medical decision making (see chart for details).   Patient is healthy 26 yo female who presents with ear pain for he past three days. Right ear exam showed non purulent fluid collection behind TM no erythema, bulging or drainage noted. Patient has congestion and sore throat likely secondary to seasonal allergy. Given exam findings and associated symptoms not concern for acute otitis media, likely sinus inflammation with minimal drainage for eustachian tube causing fluid collection. Patient was discharged with  decongestant and given return precautions if she develop worsening pain, fever and purulent drainage. Patient expresses understanding and is in agreement with plan.  Final Clinical Impressions(s) / ED Diagnoses   Final diagnoses:  Right ear pain  Congestion of nasal sinus    New Prescriptions Discharge Medication List as of 08/01/2016  1:23 PM    START taking these medications   Details  phenylephrine (SUDAFED PE CONGESTION) 10 MG TABS tablet Take 1 tablet (10 mg total) by mouth every 4 (four) hours as needed., Starting Fri 08/01/2016, Print         Lovena Neighboursiallo, Averleigh Savary, MD 08/01/16 1410    Mesner, Barbara CowerJason, MD 08/02/16 [id number]

## 2019-04-15 ENCOUNTER — Encounter (HOSPITAL_BASED_OUTPATIENT_CLINIC_OR_DEPARTMENT_OTHER): Payer: Self-pay

## 2019-04-15 ENCOUNTER — Emergency Department (HOSPITAL_BASED_OUTPATIENT_CLINIC_OR_DEPARTMENT_OTHER)
Admission: EM | Admit: 2019-04-15 | Discharge: 2019-04-15 | Disposition: A | Discharge status: Home / Self Care | Payer: No Typology Code available for payment source | Attending: Emergency Medicine | Admitting: Emergency Medicine

## 2019-04-15 ENCOUNTER — Other Ambulatory Visit: Payer: Self-pay

## 2019-04-15 DIAGNOSIS — J0191 Acute recurrent sinusitis, unspecified: Secondary | ICD-10-CM | POA: Diagnosis not present

## 2019-04-15 DIAGNOSIS — Z91013 Allergy to seafood: Secondary | ICD-10-CM | POA: Insufficient documentation

## 2019-04-15 DIAGNOSIS — R0981 Nasal congestion: Secondary | ICD-10-CM | POA: Diagnosis present

## 2019-04-15 MED ORDER — AMOXICILLIN 500 MG PO CAPS: 500.0000 mg | ORAL_CAPSULE | Freq: Three times a day (TID) | ORAL | 0 refills | Status: AC

## 2019-04-15 MED ORDER — AFRIN NASAL SPRAY 0.05 % NA SOLN: 1.0000 | Freq: Two times a day (BID) | NASAL | 0 refills | Status: AC

## 2019-04-15 MED ORDER — PSEUDOEPHEDRINE HCL ER 120 MG PO TB12: 120.0000 mg | ORAL_TABLET | Freq: Two times a day (BID) | ORAL | 1 refills | Status: DC

## 2019-04-15 MED ORDER — PSEUDOEPHEDRINE HCL ER 120 MG PO TB12: 120.0000 mg | ORAL_TABLET | Freq: Two times a day (BID) | ORAL | 1 refills | Status: AC

## 2019-04-15 MED ORDER — AMOXICILLIN 500 MG PO CAPS: 500.0000 mg | ORAL_CAPSULE | Freq: Three times a day (TID) | ORAL | 0 refills | Status: DC

## 2019-04-15 MED ORDER — AFRIN NASAL SPRAY 0.05 % NA SOLN: 1.0000 | Freq: Two times a day (BID) | NASAL | 0 refills | Status: DC

## 2019-04-15 NOTE — Discharge Instructions (Addendum)
You were seen in the ER for nasal congestion.  Given the length of your symptoms, it is likely that you may have a sinus infection.  I prescribed doxycycline which is an antibiotic for this.  Make sure to take it with food 3 times a day for 7 days.  This antibiotic may cause some abdominal pain/diarrhea.  Make sure to finish the antibiotic course to prevent antibiotic resistance.  I have also prescribed a nasal spray called Afrin.  Do not use this nasal spray for more than 3 days as it may cause dependence and worsening sinus pressure.  Also I prescribed some Sudafed for congestion.  Continue to use nasal saline spray, nette pot, humidifiers, hot showers.  If your symptoms continue to not improve, please see your family doctor for further follow-up.  Return to the ER if your symptoms worsen.

## 2019-04-15 NOTE — ED Notes (Signed)
Pt states she feels like she has a sinus infection  Pt is c/o ear pressure, sinus drainage, and facial pressure  Sxs started about a week and a half ago Pt has hx of sinus infections

## 2019-04-15 NOTE — ED Notes (Signed)
ED Provider at bedside. 

## 2019-04-15 NOTE — ED Triage Notes (Signed)
Pt c/o URI sx x 1-1.5 weeks-NAD-steady gait

## 2019-04-15 NOTE — ED Provider Notes (Signed)
[facility] EMERGENCY DEPARTMENT Provider Note   CSN: [id number] Arrival date & time: 04/15/19  1714     History Chief Complaint  Patient presents with  . URI    Norma Collier is a 29 y.o. female.  HPI 29 year old female with no significant known medical history presents to the ER for sinus congestion.  She states she has had severe sinus pressure, ear pressure, drainage, frontal headache for approximately a week and a half.  She has taken Flonase, Zyrtec, azelastine for her symptoms, which she states normally helps alleviate her symptoms.  However, she has been using these without any relief.  She has had 1 nosebleed yesterday which is common for her when she gets the sinus infections.  She was able to stop the nosebleed without any problems.  Endorses feeling fullness in ears.  She denies fevers, chills, sore throat, cough, body aches, loss of taste or smell, shortness of breath, abdominal pain, eye pain, ear pain or any other constitutional symptoms.    Past Medical History:  Diagnosis Date  . Anxiety   . Chlamydia     Patient Active Problem List   Diagnosis Date Noted  . Normal labor 12/29/2014  . SVD (spontaneous vaginal delivery) 12/29/2014  . Periurethral abrasion, delivered, current hospitalization 12/29/2014    Past Surgical History:  Procedure Laterality Date  . ANKLE SURGERY    . CARPAL TUNNEL RELEASE    . TONSILLECTOMY    . WISDOM TOOTH EXTRACTION       OB History    Gravida  1   Para  1   Term  1   Preterm      AB      Living  1     SAB      TAB      Ectopic      Multiple  0   Live Births  1           No family history on file.  Social History   Tobacco Use  . Smoking status: Never Smoker  . Smokeless tobacco: Never Used  Substance Use Topics  . Alcohol use: Not Currently  . Drug use: No    Home Medications Prior to Admission medications   Medication Sig Start Date End Date Taking? Authorizing Provider   amoxicillin (AMOXIL) 500 MG capsule Take 1 capsule (500 mg total) by mouth 3 (three) times daily. 04/15/19   Mare Ferrari, PA-C  oxymetazoline (AFRIN NASAL SPRAY) 0.05 % nasal spray Place 1 spray into both nostrils 2 (two) times daily. Do not use for longer than 3 days 04/15/19   Trudee Grip A, PA-C  phenylephrine (SUDAFED PE CONGESTION) 10 MG TABS tablet Take 1 tablet (10 mg total) by mouth every 4 (four) hours as needed. 08/01/16   Diallo, Lilia Argue, MD  pseudoephedrine (SUDAFED 12 HOUR) 120 MG 12 hr tablet Take 1 tablet (120 mg total) by mouth 2 (two) times daily. 04/15/19   Mare Ferrari, PA-C    Allergies    Shellfish allergy  Review of Systems   Review of Systems  Constitutional: Negative for chills, fatigue and fever.  HENT: Positive for congestion, nosebleeds, postnasal drip, rhinorrhea, sinus pressure, sinus pain and sneezing. Negative for ear discharge, ear pain, sore throat, tinnitus and trouble swallowing.   Eyes: Negative for pain, discharge, itching and visual disturbance.  Respiratory: Negative for cough and shortness of breath.   Cardiovascular: Negative for chest pain.  Gastrointestinal: Negative for abdominal pain.  Skin: Negative for rash.    Physical Exam Updated Vital Signs BP 133/82 (BP Location: Right Arm)   Pulse 96   Temp 99.2 F (37.3 C) (Oral)   Resp 18   Ht 5\' 7"  (1.702 m)   Wt 100.2 kg   LMP 04/05/2019   SpO2 100%   BMI 34.61 kg/m   Physical Exam Vitals reviewed.  Constitutional:      General: She is not in acute distress.    Appearance: Normal appearance. She is not ill-appearing.  HENT:     Head: Normocephalic and atraumatic.     Comments: Tenderness to palpation over maxillary sinus and frontal sinuses.  Swollen left nasal turbinate.    Right Ear: External ear normal.     Left Ear: External ear normal.     Ears:     Comments: Bulging nonerythematous tympanic membranes bilaterally    Nose: Congestion and rhinorrhea present.      Mouth/Throat:     Mouth: Mucous membranes are moist.     Pharynx: Oropharynx is clear. No posterior oropharyngeal erythema.     Comments: No erythema, uvula midline, no tonsillar exudates Eyes:     General:        Right eye: No discharge.        Left eye: No discharge.     Extraocular Movements: Extraocular movements intact.     Conjunctiva/sclera: Conjunctivae normal.  Cardiovascular:     Rate and Rhythm: Normal rate and regular rhythm.  Pulmonary:     Effort: Pulmonary effort is normal.     Breath sounds: Normal breath sounds.  Abdominal:     General: Abdomen is flat.     Palpations: Abdomen is soft.  Musculoskeletal:        General: No swelling. Normal range of motion.     Cervical back: Normal range of motion.  Neurological:     General: No focal deficit present.     Mental Status: She is alert and oriented to person, place, and time.  Psychiatric:        Mood and Affect: Mood normal.        Behavior: Behavior normal.     ED Results / Procedures / Treatments   Labs (all labs ordered are listed, but only abnormal results are displayed) Labs Reviewed - No data to display  EKG None  Radiology No results found.  Procedures Procedures (including critical care time)  Medications Ordered in ED Medications - No data to display  ED Course  I have reviewed the triage vital signs and the nursing notes.  Pertinent labs & imaging results that were available during my care of the patient were reviewed by me and considered in my medical decision making (see chart for details).    MDM Rules/Calculators/A&P                     29 year old female with sinus congestion. Patient nontoxic-appearing, speaking full sentences, no drooling, no signs of respiratory distress.  Vitals nonconcerning, patient afebrile.  Patient clearly has tenderness to palpation over maxillary and frontal sinuses with associated nasal congestion.  TMs bulging, intact, nonerythematous bilaterally.   Given she has had symptoms for more than 7 days, suspect sinus infection.  Will prescribe doxycycline twice daily x7 days, Sudafed for congestion per patient request, in addition to Afrin.  Patient given strict instructions to not take Afrin for more than 3 days as using it any longer than that can cause dependence and worsening  rebound sinus congestion.  Encouraged Nettie pot, saline nasal spray, warm hot baths, steams.  Encouraged follow-up with PCP/allergist (patient states that she follows with one) if symptoms not improved after course of antibiotics.  Return precautions given.  Patient voices understanding is agreeable to this plan.   Final Clinical Impression(s) / ED Diagnoses Final diagnoses:  Acute recurrent sinusitis, unspecified location    Rx / DC Orders ED Discharge Orders         Ordered    amoxicillin (AMOXIL) 500 MG capsule  3 times daily,   Status:  Discontinued     04/15/19 2152    oxymetazoline (AFRIN NASAL SPRAY) 0.05 % nasal spray  2 times daily,   Status:  Discontinued     04/15/19 2152    pseudoephedrine (SUDAFED 12 HOUR) 120 MG 12 hr tablet  2 times daily,   Status:  Discontinued     04/15/19 2152    amoxicillin (AMOXIL) 500 MG capsule  3 times daily     04/15/19 2229    oxymetazoline (AFRIN NASAL SPRAY) 0.05 % nasal spray  2 times daily     04/15/19 2229    pseudoephedrine (SUDAFED 12 HOUR) 120 MG 12 hr tablet  2 times daily     04/15/19 2229           Mare Ferrari, PA-C 04/15/19 2249    Maia Plan, MD 04/16/19 1052

## 2021-01-08 ENCOUNTER — Emergency Department (HOSPITAL_COMMUNITY): Payer: No Typology Code available for payment source

## 2021-01-08 ENCOUNTER — Emergency Department (HOSPITAL_COMMUNITY)
Admission: EM | Admit: 2021-01-08 | Discharge: 2021-01-08 | Disposition: A | Discharge status: Home / Self Care | Payer: No Typology Code available for payment source | Attending: Emergency Medicine | Admitting: Emergency Medicine

## 2021-01-08 ENCOUNTER — Encounter (HOSPITAL_COMMUNITY): Payer: Self-pay

## 2021-01-08 ENCOUNTER — Other Ambulatory Visit: Payer: Self-pay

## 2021-01-08 DIAGNOSIS — Y92410 Unspecified street and highway as the place of occurrence of the external cause: Secondary | ICD-10-CM | POA: Insufficient documentation

## 2021-01-08 DIAGNOSIS — R110 Nausea: Secondary | ICD-10-CM | POA: Diagnosis not present

## 2021-01-08 DIAGNOSIS — M25552 Pain in left hip: Secondary | ICD-10-CM | POA: Diagnosis not present

## 2021-01-08 DIAGNOSIS — M549 Dorsalgia, unspecified: Secondary | ICD-10-CM | POA: Diagnosis not present
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
## 2021-01-08 MED ORDER — METHOCARBAMOL 500 MG PO TABS: 500.0000 mg | ORAL_TABLET | Freq: Two times a day (BID) | ORAL | 0 refills | Status: DC

## 2021-01-08 MED ORDER — ONDANSETRON 4 MG PO TBDP
4.0000 mg | ORAL_TABLET | Freq: Once | ORAL | Status: AC
  Administered 2021-01-08: 4 mg via ORAL
  Filled 2021-01-08: qty 1

## 2021-01-08 MED ORDER — METHOCARBAMOL 500 MG PO TABS
500.0000 mg | ORAL_TABLET | Freq: Once | ORAL | Status: AC
  Administered 2021-01-08: 500 mg via ORAL
  Filled 2021-01-08: qty 1
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
## 2021-01-08 NOTE — Discharge Instructions (Signed)
You were seen in the emergency department today for hip pain.  As we discussed there was no fractures or dislocations on your x-ray.  We normally treat your symptoms, which is likely muscular tenderness, with muscle relaxers and over-the-counter medications like ibuprofen or Tylenol as needed for pain.  I have given you 1 dose of the muscle relaxer here, as well as a nausea medicine.  I am prescribing the rest of the muscle relaxer for you to take at home.  You can take this twice daily as needed.  Continue to monitor how you're doing and return to the ER for new or worsening symptoms such as numbness, tingling, difficulties using the bathroom as these would be suggestive of spinal cord injury.   It has been a pleasure seeing and caring for you today and I hope you start feeling better soon!

## 2021-02-05 ENCOUNTER — Other Ambulatory Visit (HOSPITAL_COMMUNITY): Payer: Self-pay | Admitting: Physician Assistant

## 2021-02-05 ENCOUNTER — Other Ambulatory Visit: Payer: Self-pay | Admitting: Physician Assistant

## 2021-02-05 DIAGNOSIS — M545 Low back pain, unspecified: Secondary | ICD-10-CM

## 2021-02-05 DIAGNOSIS — M25562 Pain in left knee: Secondary | ICD-10-CM

## 2021-02-15 ENCOUNTER — Ambulatory Visit (HOSPITAL_COMMUNITY)
Admission: RE | Admit: 2021-02-15 | Discharge: 2021-02-15 | Disposition: A | Discharge status: Home / Self Care | Payer: No Typology Code available for payment source | Source: Ambulatory Visit | Attending: Physician Assistant | Admitting: Physician Assistant

## 2021-02-15 ENCOUNTER — Other Ambulatory Visit: Payer: Self-pay

## 2021-02-15 DIAGNOSIS — M545 Low back pain, unspecified: Secondary | ICD-10-CM | POA: Diagnosis present

## 2021-02-15 DIAGNOSIS — M25562 Pain in left knee: Secondary | ICD-10-CM | POA: Insufficient documentation
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
## 2021-10-22 ENCOUNTER — Other Ambulatory Visit (HOSPITAL_BASED_OUTPATIENT_CLINIC_OR_DEPARTMENT_OTHER): Payer: Self-pay | Admitting: Otolaryngology

## 2021-10-22 ENCOUNTER — Other Ambulatory Visit (HOSPITAL_COMMUNITY): Payer: Self-pay | Admitting: Otolaryngology

## 2021-10-22 DIAGNOSIS — R42 Dizziness and giddiness: Secondary | ICD-10-CM

## 2021-11-02 ENCOUNTER — Ambulatory Visit (HOSPITAL_COMMUNITY)
Admission: RE | Admit: 2021-11-02 | Discharge: 2021-11-02 | Disposition: A | Discharge status: Home / Self Care | Payer: No Typology Code available for payment source | Source: Ambulatory Visit | Attending: Otolaryngology | Admitting: Otolaryngology

## 2021-11-02 DIAGNOSIS — R42 Dizziness and giddiness: Secondary | ICD-10-CM

## 2021-11-02 MED ORDER — GADOBUTROL 1 MMOL/ML IV SOLN
10.0000 mL | Freq: Once | INTRAVENOUS | Status: AC | PRN
  Administered 2021-11-02: 10 mL via INTRAVENOUS

## 2022-01-23 ENCOUNTER — Ambulatory Visit: Payer: No Typology Code available for payment source | Admitting: Dietician

## 2022-02-24 ENCOUNTER — Encounter: Payer: No Typology Code available for payment source | Attending: Internal Medicine | Admitting: Dietician

## 2022-02-24 ENCOUNTER — Encounter: Payer: Self-pay | Admitting: Dietician

## 2022-02-24 VITALS — Ht 67.0 in

## 2022-02-24 DIAGNOSIS — E668 Other obesity: Secondary | ICD-10-CM | POA: Insufficient documentation
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
## 2023-05-08 ENCOUNTER — Emergency Department (HOSPITAL_BASED_OUTPATIENT_CLINIC_OR_DEPARTMENT_OTHER)

## 2023-05-08 ENCOUNTER — Emergency Department (HOSPITAL_BASED_OUTPATIENT_CLINIC_OR_DEPARTMENT_OTHER)
Admission: EM | Admit: 2023-05-08 | Discharge: 2023-05-08 | Disposition: A | Discharge status: Home / Self Care | Attending: Emergency Medicine | Admitting: Emergency Medicine

## 2023-05-08 ENCOUNTER — Encounter (HOSPITAL_BASED_OUTPATIENT_CLINIC_OR_DEPARTMENT_OTHER): Payer: Self-pay

## 2023-05-08 DIAGNOSIS — Z79899 Other long term (current) drug therapy: Secondary | ICD-10-CM | POA: Diagnosis not present

## 2023-05-08 DIAGNOSIS — M542 Cervicalgia: Secondary | ICD-10-CM | POA: Diagnosis not present

## 2023-05-08 DIAGNOSIS — Y92410 Unspecified street and highway as the place of occurrence of the external cause: Secondary | ICD-10-CM | POA: Insufficient documentation

## 2023-05-08 DIAGNOSIS — G44319 Acute post-traumatic headache, not intractable: Secondary | ICD-10-CM | POA: Insufficient documentation

## 2023-05-08 DIAGNOSIS — M545 Low back pain, unspecified: Secondary | ICD-10-CM | POA: Diagnosis not present

## 2023-05-08 DIAGNOSIS — R519 Headache, unspecified: Secondary | ICD-10-CM | POA: Diagnosis present

## 2023-05-08 MED ORDER — KETOROLAC TROMETHAMINE 15 MG/ML IJ SOLN
15.0000 mg | Freq: Once | INTRAMUSCULAR | Status: AC
  Administered 2023-05-08: 15 mg via INTRAVENOUS
  Filled 2023-05-08: qty 1

## 2023-05-08 MED ORDER — NAPROXEN 500 MG PO TABS: 500.0000 mg | ORAL_TABLET | Freq: Two times a day (BID) | ORAL | 0 refills | Status: AC

## 2023-05-08 MED ORDER — DIPHENHYDRAMINE HCL 50 MG/ML IJ SOLN
12.5000 mg | Freq: Once | INTRAMUSCULAR | Status: AC
  Administered 2023-05-08: 12.5 mg via INTRAVENOUS
  Filled 2023-05-08: qty 1

## 2023-05-08 MED ORDER — METHOCARBAMOL 500 MG PO TABS: 500.0000 mg | ORAL_TABLET | Freq: Two times a day (BID) | ORAL | 0 refills | Status: AC

## 2023-05-08 MED ORDER — PROCHLORPERAZINE EDISYLATE 10 MG/2ML IJ SOLN
10.0000 mg | Freq: Once | INTRAMUSCULAR | Status: AC
  Administered 2023-05-08: 10 mg via INTRAVENOUS
  Filled 2023-05-08: qty 2
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
## 2023-05-08 NOTE — Discharge Instructions (Signed)
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
## 2023-05-08 NOTE — ED Notes (Signed)
D&C IV
